# Patient Record
Sex: Female | Born: 1960
Health system: Southern US, Community
[De-identification: ages and names within clinical notes are randomized; demographics above are authoritative.]

## PROBLEM LIST (undated history)

## (undated) DIAGNOSIS — D039 Melanoma in situ, unspecified: Secondary | ICD-10-CM

## (undated) DIAGNOSIS — T7840XA Allergy, unspecified, initial encounter: Secondary | ICD-10-CM

## (undated) HISTORY — PX: FACIAL COSMETIC SURGERY: SHX629

## (undated) HISTORY — DX: Allergy, unspecified, initial encounter: T78.40XA

---

## 1983-08-08 DIAGNOSIS — D039 Melanoma in situ, unspecified: Secondary | ICD-10-CM | POA: Insufficient documentation

## 1997-11-19 ENCOUNTER — Other Ambulatory Visit: Admission: RE | Admit: 1997-11-19 | Discharge: 1997-11-19 | Payer: Self-pay | Admitting: Gynecology

## 1999-01-18 ENCOUNTER — Other Ambulatory Visit: Admission: RE | Admit: 1999-01-18 | Discharge: 1999-01-18 | Payer: Self-pay | Admitting: Gynecology

## 1999-12-14 ENCOUNTER — Encounter: Payer: Self-pay | Admitting: Gynecology

## 1999-12-14 ENCOUNTER — Encounter: Admission: RE | Admit: 1999-12-14 | Discharge: 1999-12-14 | Payer: Self-pay | Admitting: Gynecology

## 2000-01-20 ENCOUNTER — Other Ambulatory Visit: Admission: RE | Admit: 2000-01-20 | Discharge: 2000-01-20 | Payer: Self-pay | Admitting: Gynecology

## 2001-02-09 ENCOUNTER — Encounter: Admission: RE | Admit: 2001-02-09 | Discharge: 2001-02-09 | Payer: Self-pay | Admitting: Gynecology

## 2001-02-09 ENCOUNTER — Encounter: Payer: Self-pay | Admitting: Gynecology

## 2001-03-06 ENCOUNTER — Other Ambulatory Visit: Admission: RE | Admit: 2001-03-06 | Discharge: 2001-03-06 | Payer: Self-pay | Admitting: Gynecology

## 2001-09-12 ENCOUNTER — Encounter: Admission: RE | Admit: 2001-09-12 | Discharge: 2001-09-12 | Payer: Self-pay | Admitting: Otolaryngology

## 2001-09-12 ENCOUNTER — Encounter: Payer: Self-pay | Admitting: Otolaryngology

## 2002-03-14 ENCOUNTER — Encounter: Admission: RE | Admit: 2002-03-14 | Discharge: 2002-03-14 | Payer: Self-pay | Admitting: Gynecology

## 2002-03-14 ENCOUNTER — Encounter: Payer: Self-pay | Admitting: Gynecology

## 2002-04-08 ENCOUNTER — Other Ambulatory Visit: Admission: RE | Admit: 2002-04-08 | Discharge: 2002-04-08 | Payer: Self-pay | Admitting: Gynecology

## 2003-07-28 ENCOUNTER — Encounter: Admission: RE | Admit: 2003-07-28 | Discharge: 2003-07-28 | Payer: Self-pay | Admitting: Gynecology

## 2003-12-12 ENCOUNTER — Other Ambulatory Visit: Admission: RE | Admit: 2003-12-12 | Discharge: 2003-12-12 | Payer: Self-pay | Admitting: Obstetrics and Gynecology

## 2004-09-09 ENCOUNTER — Encounter: Admission: RE | Admit: 2004-09-09 | Discharge: 2004-09-09 | Payer: Self-pay | Admitting: Obstetrics and Gynecology

## 2005-10-12 ENCOUNTER — Other Ambulatory Visit: Admission: RE | Admit: 2005-10-12 | Discharge: 2005-10-12 | Payer: Self-pay | Admitting: Gynecology

## 2005-11-29 ENCOUNTER — Encounter: Admission: RE | Admit: 2005-11-29 | Discharge: 2005-11-29 | Payer: Self-pay | Admitting: Gynecology

## 2006-06-20 ENCOUNTER — Ambulatory Visit: Payer: Self-pay | Admitting: Internal Medicine

## 2006-06-26 ENCOUNTER — Encounter: Payer: Self-pay | Admitting: Internal Medicine

## 2006-06-26 LAB — CONVERTED CEMR LAB: Vit D, 1,25-Dihydroxy: 45 (ref 20–57)

## 2006-06-27 ENCOUNTER — Ambulatory Visit: Payer: Self-pay | Admitting: Internal Medicine

## 2006-06-27 LAB — CONVERTED CEMR LAB
ALT: 14 units/L (ref 0–40)
AST: 21 units/L (ref 0–37)
Albumin: 3.8 g/dL (ref 3.5–5.2)
Alkaline Phosphatase: 34 units/L — ABNORMAL LOW (ref 39–117)
BUN: 13 mg/dL (ref 6–23)
Basophils Absolute: 0 10*3/uL (ref 0.0–0.1)
Basophils Relative: 0.3 % (ref 0.0–1.0)
Bilirubin Urine: NEGATIVE
Bilirubin, Direct: 0.1 mg/dL (ref 0.0–0.3)
CO2: 29 meq/L (ref 19–32)
Calcium: 9 mg/dL (ref 8.4–10.5)
Chloride: 104 meq/L (ref 96–112)
Cholesterol: 190 mg/dL (ref 0–200)
Creatinine, Ser: 0.6 mg/dL (ref 0.4–1.2)
Eosinophils Absolute: 10.9 10*3/uL — ABNORMAL HIGH (ref 0.0–0.6)
Eosinophils Relative: 10.9 % — ABNORMAL HIGH (ref 0.0–5.0)
GFR calc Af Amer: 138 mL/min
GFR calc non Af Amer: 114 mL/min
Glucose, Bld: 83 mg/dL (ref 70–99)
H Pylori IgG: NEGATIVE
HCT: 35.5 % — ABNORMAL LOW (ref 36.0–46.0)
HDL: 49.9 mg/dL (ref >39.0)
Hemoglobin, Urine: NEGATIVE
Hemoglobin: 11.8 g/dL — ABNORMAL LOW (ref 12.0–15.0)
Ketones, ur: NEGATIVE mg/dL
LDL Cholesterol: 124 mg/dL — ABNORMAL HIGH (ref 0–99)
Leukocytes, UA: NEGATIVE
Lymphocytes Relative: 24.8 % (ref 12.0–46.0)
MCHC: 33.3 g/dL (ref 30.0–36.0)
MCV: 88.2 fL (ref 78.0–100.0)
Monocytes Absolute: 0.6 10*3/uL (ref 0.2–0.7)
Monocytes Relative: 10 % (ref 3.0–11.0)
Neutro Abs: 3.2 10*3/uL (ref 1.4–7.7)
Neutrophils Relative %: 54 % (ref 43.0–77.0)
Nitrite: NEGATIVE
Platelets: 196 10*3/uL (ref 150–400)
Potassium: 4 meq/L (ref 3.5–5.1)
RBC: 4.02 M/uL (ref 3.87–5.11)
RDW: 12.2 % (ref 11.5–14.6)
Sodium: 138 meq/L (ref 135–145)
Specific Gravity, Urine: 1.02 (ref 1.000–1.03)
TSH: 3.67 microintl units/mL (ref 0.35–5.50)
Total Bilirubin: 0.5 mg/dL (ref 0.3–1.2)
Total CHOL/HDL Ratio: 3.8
Total Protein, Urine: NEGATIVE mg/dL
Total Protein: 6.7 g/dL (ref 6.0–8.3)
Triglycerides: 83 mg/dL (ref 0–149)
Urine Glucose: NEGATIVE mg/dL
Urobilinogen, UA: 0.2 (ref 0.0–1.0)
VLDL: 17 mg/dL (ref 0–40)
WBC: 6 10*3/uL (ref 4.5–10.5)
pH: 6 (ref 5.0–8.0)

## 2007-01-12 ENCOUNTER — Other Ambulatory Visit: Admission: RE | Admit: 2007-01-12 | Discharge: 2007-01-12 | Payer: Self-pay | Admitting: Gynecology

## 2007-01-19 ENCOUNTER — Encounter: Admission: RE | Admit: 2007-01-19 | Discharge: 2007-01-19 | Payer: Self-pay | Admitting: Gynecology

## 2007-02-13 ENCOUNTER — Ambulatory Visit: Payer: Self-pay | Admitting: Internal Medicine

## 2007-02-13 DIAGNOSIS — J069 Acute upper respiratory infection, unspecified: Secondary | ICD-10-CM | POA: Insufficient documentation

## 2007-02-13 DIAGNOSIS — J45909 Unspecified asthma, uncomplicated: Secondary | ICD-10-CM | POA: Insufficient documentation

## 2007-02-13 DIAGNOSIS — J309 Allergic rhinitis, unspecified: Secondary | ICD-10-CM | POA: Insufficient documentation

## 2007-07-02 DIAGNOSIS — B001 Herpesviral vesicular dermatitis: Secondary | ICD-10-CM | POA: Insufficient documentation

## 2008-01-25 ENCOUNTER — Ambulatory Visit: Payer: Self-pay | Admitting: Internal Medicine

## 2008-01-25 ENCOUNTER — Telehealth: Payer: Self-pay | Admitting: Internal Medicine

## 2008-02-12 ENCOUNTER — Encounter: Admission: RE | Admit: 2008-02-12 | Discharge: 2008-02-12 | Payer: Self-pay | Admitting: Gynecology

## 2009-01-22 ENCOUNTER — Encounter: Payer: Self-pay | Admitting: Internal Medicine

## 2009-01-23 ENCOUNTER — Encounter: Payer: Self-pay | Admitting: Internal Medicine

## 2009-03-20 ENCOUNTER — Encounter: Admission: RE | Admit: 2009-03-20 | Discharge: 2009-03-20 | Payer: Self-pay | Admitting: Gynecology

## 2010-02-28 ENCOUNTER — Encounter: Payer: Self-pay | Admitting: Gynecology

## 2010-03-09 NOTE — Miscellaneous (Signed)
Summary: Flu Vaccination/[facility]  Flu Vaccination/[facility]   Imported By: Sherian Rein 01/26/2009 11:41:30  _____________________________________________________________________  External Attachment:    Type:   Image     Comment:   External Document

## 2010-03-09 NOTE — Miscellaneous (Signed)
Summary: Entered flu vacc.  Clinical Lists Changes       Not Administered:    Influenza Vaccine not given due to: Rec'd at [facility] on 01/22/09

## 2010-03-09 NOTE — Assessment & Plan Note (Signed)
Summary: asthma/cough SD   Vital Signs:  Patient Profile:   50 Years Old Female Weight:      166 pounds Temp:     98.6 degrees F oral Pulse rate:   80 / minute Pulse rhythm:   regular BP sitting:   134 / 88  (left arm) Cuff size:   regular  Vitals Entered By: Rock Nephew CMA (January 25, 2008 1:43 PM)                 Chief Complaint:  cough and bronchitis.  History of Present Illness: I am seeing this pt. for the first time today as she complains of a 1 week hx. of cough productive of yellow phlegm with ST, wheezing, SOB, and earaches.  Acute Visit History:      The patient complains of cough, earache, sinus problems, and sore throat.  She denies chest pain, fever, genitourinary symptoms, nasal discharge, nausea, rash, and vomiting.        The patient notes wheezing and shortness of breath.  The cough interferes with her sleep.  The character of the cough is described as productive.  She has no history of COPD.  There is no history of respiratory retractions, tachypnea, cyanosis, or interference with oral intake associated with her cough.        The earache is located on both sides.  There have been 'cold' or URI symptoms associated with the earache.  There is no history of recent antibiotic usage or recurrent otitis media associated with the earache.        'Cold' or URI symptoms have been present with the sore throat.  There is no history of dysphagia, drooling, or recent exposure to strep.        She complains of sinus pressure.  She denies a previous history of sinusitis or previous sinus surgery.           Updated Prior Medication List: VITAMIN D3 1000 UNIT  TABS (CHOLECALCIFEROL) 1 qd PROAIR HFA 108 (90 BASE) MCG/ACT  AERS (ALBUTEROL SULFATE) 2 inh q4h as needed shortness of breath  Current Allergies (reviewed today): No known allergies   Past Medical History:    Reviewed history from 02/13/2007 and no changes required:       Asthma/ cyclical cough  Allergic rhinitis cats  Past Surgical History:    Denies surgical history   Family History:    Reviewed history from 02/13/2007 and no changes required:       Family History Hypertension  Social History:    Reviewed history from 02/13/2007 and no changes required:       Occupation:       Married       Never Smoked       Alcohol use-no       Drug use-no       Regular exercise-yes   Risk Factors:  Tobacco use:  never Passive smoke exposure:  no Drug use:  no HIV high-risk behavior:  no Alcohol use:  no Exercise:  yes  Family History Risk Factors:    Family History of MI in females < 42 years old:  no    Family History of MI in males < 13 years old:  no   Review of Systems       The patient complains of enlarged lymph nodes.  The patient denies anorexia, fever, hoarseness, chest pain, hemoptysis, abdominal pain, and angioedema.     Physical Exam  General:  alert, well-developed, well-nourished, well-hydrated, appropriate dress, and normal appearance.   Lungs:     positive for good air movement but diffuse, bilateral inspiratory wheezes and rhonchi but no decreased breath sounds. normal respiratory effort, no intercostal retractions, no accessory muscle use, no dullness, no fremitus, and no crackles.   Heart:     Normal rate and regular rhythm. S1 and S2 normal without gallop, murmur, click, rub or other extra sounds. Abdomen:     Bowel sounds positive,abdomen soft and non-tender without masses, organomegaly or hernias noted. Extremities:     No clubbing, cyanosis, edema, or deformity noted with normal full range of motion of all joints.   Skin:     turgor normal, color normal, no rashes, no suspicious lesions, no ecchymoses, no petechiae, no purpura, no ulcerations, and no edema.   Psych:     Cognition and judgment appear intact. Alert and cooperative with normal attention span and concentration. No apparent delusions, illusions, hallucinations    Impression  & Recommendations:  Problem # 1:  UPPER RESPIRATORY INFECTION (URI) (ICD-465.9) Assessment: Deteriorated start Zpak The following medications were removed from the medication list:    Promethazine-codeine 6.25-10 Mg/40ml Syrp (Promethazine-codeine) .Marland Kitchen... 5-10 cc q 6h prn  Her updated medication list for this problem includes:    Tussionex Pennkinetic Er 8-10 Mg/55ml Lqcr (Chlorpheniramine-hydrocodone) .Marland KitchenMarland KitchenMarland KitchenMarland Kitchen 5 ml by mouth two times a day as needed for cough  Orders: Admin of Therapeutic Inj  intramuscular or subcutaneous (27253) Depo- Medrol 40mg  (J1030) Depo- Medrol 80mg  (J1040) Nebulizer Tx (66440)   Problem # 2:  ASTHMA (ICD-493.90) Assessment: Deteriorated depo-medrol 120mg  IM. She received perforomist 20 mg via neb and repeat exam shows a marked decrease in wheezes and rhonchi Her updated medication list for this problem includes:    Proair Hfa 108 (90 Base) Mcg/act Aers (Albuterol sulfate) .Marland Kitchen... 2 inh q4h as needed shortness of breath  Orders: Nebulizer Tx (34742)   Complete Medication List: 1)  Vitamin D3 1000 Unit Tabs (Cholecalciferol) .Marland Kitchen.. 1 qd 2)  Proair Hfa 108 (90 Base) Mcg/act Aers (Albuterol sulfate) .... 2 inh q4h as needed shortness of breath 3)  Zithromax 500 Mg Tab (Azithromycin) .... One by mouth once daily for 3 days 4)  Tussionex Pennkinetic Er 8-10 Mg/22ml Lqcr (Chlorpheniramine-hydrocodone) .... 5 ml by mouth two times a day as needed for cough   Patient Instructions: 1)  Please schedule a follow-up appointment in 2 weeks. 2)  It is important to use your inhaler properly. Use a spacer, take slow deep breaths and hold them. Rinse your mouth after using.  3)  Take your antibiotic as prescribed until ALL of it is gone, but stop if you develop a rash or swelling and contact our office as soon as possible. 4)  Acute bronchitis symptoms for less than 10 days are not helped by antibiotics. take over the counter cough medications. call if no improvment in  5-7 days,  sooner if increasing cough, fever, or new symptoms( shortness of breath, chest pain).   Prescriptions: TUSSIONEX PENNKINETIC ER 8-10 MG/5ML LQCR (CHLORPHENIRAMINE-HYDROCODONE) 5 ml by mouth two times a day as needed for cough  #3 ounces x 0   Entered and Authorized by:   Etta Grandchild MD   Signed by:   Etta Grandchild MD on 01/25/2008   Method used:   Print then Give to Patient   RxID:   [id number] ZITHROMAX 500 MG TAB (AZITHROMYCIN) one by mouth once daily for 3 days  #3 x  0   Entered and Authorized by:   Etta Grandchild MD   Signed by:   Etta Grandchild MD on 01/25/2008   Method used:   Print then Give to Patient   RxID:   [id number]  ]  Medication Administration  Injection # 1:    Medication: Depo- Medrol 80mg     Diagnosis: UPPER RESPIRATORY INFECTION (URI) (ICD-465.9)    Route: IM    Site: RUOQ gluteus    Exp Date: 02/2009    Lot #: [id number]    Mfr: sicor    Patient tolerated injection without complications    Given by: Rock Nephew CMA (January 25, 2008 2:21 PM)  Injection # 2:    Medication: Depo- Medrol 40mg     Diagnosis: UPPER RESPIRATORY INFECTION (URI) (ICD-465.9)    Route: IM    Site: RUOQ gluteus    Exp Date: 02/2009    Lot #: [id number]    Mfr: sicor    Patient tolerated injection without complications    Given by: Rock Nephew CMA (January 25, 2008 2:21 PM)  Orders Added: 1)  Admin of Therapeutic Inj  intramuscular or subcutaneous [96372] 2)  Depo- Medrol 40mg  [J1030] 3)  Depo- Medrol 80mg  [J1040] 4)  Est. Patient Level IV [84696] 5)  Nebulizer Tx [29528]

## 2010-03-09 NOTE — Progress Notes (Signed)
Summary: Req for Rx  Phone Note Call from Patient   Summary of Call: Pt says she gets asthmatic bronchitis every year after a "cold' which she has now. Pt usually gets zpak, albuterol & prometh/cod cough syrup. She has albuterol & cough med. Patient is requesting rx for zpak for bronchitis. Scheduled pt for 1:45 with Dr Yetta Barre Initial call taken by: Lamar Sprinkles,  January 25, 2008 9:39 AM

## 2010-03-12 ENCOUNTER — Other Ambulatory Visit: Payer: Self-pay | Admitting: Gynecology

## 2010-03-12 DIAGNOSIS — Z1239 Encounter for other screening for malignant neoplasm of breast: Secondary | ICD-10-CM

## 2010-03-22 ENCOUNTER — Ambulatory Visit
Admission: RE | Admit: 2010-03-22 | Discharge: 2010-03-22 | Disposition: A | Discharge status: Home / Self Care | Payer: BC Managed Care – PPO | Source: Ambulatory Visit | Attending: Gynecology | Admitting: Gynecology

## 2010-03-22 DIAGNOSIS — Z1239 Encounter for other screening for malignant neoplasm of breast: Secondary | ICD-10-CM

## 2010-12-02 ENCOUNTER — Telehealth: Payer: Self-pay | Admitting: *Deleted

## 2010-12-02 DIAGNOSIS — Z0000 Encounter for general adult medical examination without abnormal findings: Secondary | ICD-10-CM

## 2010-12-02 NOTE — Telephone Encounter (Signed)
Message copied by Merrilyn Puma on Thu Dec 02, 2010  9:53 AM ------      Message from: Earl Lagos      Created: Wed Dec 01, 2010  4:57 PM      Regarding: labs       Please schedule labs for cpx 02/07/11. Thanks

## 2010-12-02 NOTE — Telephone Encounter (Signed)
Labs entered.

## 2011-01-27 ENCOUNTER — Other Ambulatory Visit (INDEPENDENT_AMBULATORY_CARE_PROVIDER_SITE_OTHER): Payer: BC Managed Care – PPO

## 2011-01-27 DIAGNOSIS — Z0000 Encounter for general adult medical examination without abnormal findings: Secondary | ICD-10-CM

## 2011-01-27 LAB — CBC WITH DIFFERENTIAL/PLATELET
Basophils Absolute: 0 10*3/uL (ref 0.0–0.1)
Basophils Relative: 0.7 % (ref 0.0–3.0)
Eosinophils Absolute: 0.2 10*3/uL (ref 0.0–0.7)
Eosinophils Relative: 4.4 % (ref 0.0–5.0)
HCT: 39.9 % (ref 36.0–46.0)
Hemoglobin: 13.7 g/dL (ref 12.0–15.0)
Lymphocytes Relative: 31.9 % (ref 12.0–46.0)
Lymphs Abs: 1.6 10*3/uL (ref 0.7–4.0)
MCHC: 34.4 g/dL (ref 30.0–36.0)
MCV: 94.8 fl (ref 78.0–100.0)
Monocytes Absolute: 0.4 10*3/uL (ref 0.1–1.0)
Monocytes Relative: 9 % (ref 3.0–12.0)
Neutro Abs: 2.7 10*3/uL (ref 1.4–7.7)
Neutrophils Relative %: 54 % (ref 43.0–77.0)
Platelets: 183 10*3/uL (ref 150.0–400.0)
RBC: 4.21 Mil/uL (ref 3.87–5.11)
RDW: 13.1 % (ref 11.5–14.6)
WBC: 5 10*3/uL (ref 4.5–10.5)

## 2011-01-27 LAB — BASIC METABOLIC PANEL
BUN: 18 mg/dL (ref 6–23)
CO2: 29 mEq/L (ref 19–32)
Calcium: 8.9 mg/dL (ref 8.4–10.5)
Chloride: 102 mEq/L (ref 96–112)
Creatinine, Ser: 0.9 mg/dL (ref 0.4–1.2)
GFR: 68.01 mL/min (ref >60.00)
Glucose, Bld: 94 mg/dL (ref 70–99)
Potassium: 3.8 mEq/L (ref 3.5–5.1)
Sodium: 137 mEq/L (ref 135–145)

## 2011-01-27 LAB — URINALYSIS, ROUTINE W REFLEX MICROSCOPIC
Bilirubin Urine: NEGATIVE
Ketones, ur: NEGATIVE
Leukocytes, UA: NEGATIVE
Nitrite: NEGATIVE
Specific Gravity, Urine: 1.015 (ref 1.000–1.030)
Total Protein, Urine: NEGATIVE
Urine Glucose: NEGATIVE
Urobilinogen, UA: 0.2 (ref 0.0–1.0)
pH: 7.5 (ref 5.0–8.0)

## 2011-01-27 LAB — LIPID PANEL
Cholesterol: 174 mg/dL (ref 0–200)
HDL: 50.8 mg/dL (ref >39.00)
LDL Cholesterol: 103 mg/dL — ABNORMAL HIGH (ref 0–99)
Total CHOL/HDL Ratio: 3
Triglycerides: 103 mg/dL (ref 0.0–149.0)
VLDL: 20.6 mg/dL (ref 0.0–40.0)

## 2011-01-27 LAB — HEPATIC FUNCTION PANEL
ALT: 21 U/L (ref 0–35)
AST: 23 U/L (ref 0–37)
Albumin: 4 g/dL (ref 3.5–5.2)
Alkaline Phosphatase: 48 U/L (ref 39–117)
Bilirubin, Direct: 0 mg/dL (ref 0.0–0.3)
Total Bilirubin: 0.6 mg/dL (ref 0.3–1.2)
Total Protein: 6.9 g/dL (ref 6.0–8.3)

## 2011-01-27 LAB — TSH: TSH: 2.23 u[IU]/mL (ref 0.35–5.50)

## 2011-02-04 ENCOUNTER — Encounter: Payer: Self-pay | Admitting: Internal Medicine

## 2011-02-07 ENCOUNTER — Ambulatory Visit (INDEPENDENT_AMBULATORY_CARE_PROVIDER_SITE_OTHER): Payer: BC Managed Care – PPO | Admitting: Internal Medicine

## 2011-02-07 ENCOUNTER — Encounter: Payer: Self-pay | Admitting: Internal Medicine

## 2011-02-07 VITALS — BP 120/88 | HR 84 | Temp 97.2°F | Resp 16 | Ht 69.0 in | Wt 169.0 lb

## 2011-02-07 DIAGNOSIS — Z23 Encounter for immunization: Secondary | ICD-10-CM

## 2011-02-07 DIAGNOSIS — B079 Viral wart, unspecified: Secondary | ICD-10-CM

## 2011-02-07 DIAGNOSIS — Z0000 Encounter for general adult medical examination without abnormal findings: Secondary | ICD-10-CM

## 2011-02-07 DIAGNOSIS — Z136 Encounter for screening for cardiovascular disorders: Secondary | ICD-10-CM

## 2011-02-07 NOTE — Progress Notes (Signed)
  Subjective:    Patient ID: Megan Garner, female    DOB: 06-23-1960, 50 y.o.   MRN: [id number]  HPI  The patient is here for a wellness exam. The patient has been doing well overall without major physical or psychological issues going on lately.Review of Systems  Constitutional: Negative for fever, chills, diaphoresis, activity change, appetite change, fatigue and unexpected weight change.  HENT: Negative for hearing loss, ear pain, congestion, sore throat, sneezing, mouth sores, neck pain, dental problem, voice change, postnasal drip and sinus pressure.   Eyes: Negative for pain and visual disturbance.  Respiratory: Negative for cough, chest tightness, wheezing and stridor.   Cardiovascular: Negative for chest pain, palpitations and leg swelling.  Gastrointestinal: Negative for nausea, vomiting, abdominal pain, blood in stool, abdominal distention and rectal pain.  Genitourinary: Negative for dysuria, hematuria, decreased urine volume, vaginal bleeding, vaginal discharge, difficulty urinating, vaginal pain and menstrual problem.  Musculoskeletal: Negative for back pain, joint swelling and gait problem.  Skin: Positive for rash (warts on arm). Negative for color change and wound.  Neurological: Negative for dizziness, tremors, syncope, speech difficulty and light-headedness.  Hematological: Negative for adenopathy.  Psychiatric/Behavioral: Negative for suicidal ideas, hallucinations, behavioral problems, confusion, sleep disturbance, dysphoric mood and decreased concentration. The patient is not hyperactive.        Objective:   Physical Exam  Constitutional: She appears well-developed. No distress.  HENT:  Head: Normocephalic.  Right Ear: External ear normal.  Left Ear: External ear normal.  Nose: Nose normal.  Mouth/Throat: Oropharynx is clear and moist.  Eyes: Conjunctivae are normal. Pupils are equal, round, and reactive to light. Right eye exhibits no discharge. Left eye exhibits no  discharge.  Neck: Normal range of motion. Neck supple. No JVD present. No tracheal deviation present. No thyromegaly present.  Cardiovascular: Normal rate, regular rhythm and normal heart sounds.   Pulmonary/Chest: No stridor. No respiratory distress. She has no wheezes.  Abdominal: Soft. Bowel sounds are normal. She exhibits no distension and no mass. There is no tenderness. There is no rebound and no guarding.  Musculoskeletal: She exhibits no edema and no tenderness.  Lymphadenopathy:    She has no cervical adenopathy.  Neurological: She displays normal reflexes. No cranial nerve deficit. She exhibits normal muscle tone. Coordination normal.  Skin: No rash noted. No erythema.       R mid forearm and L elbow 3 mm warts  Psychiatric: She has a normal mood and affect. Her behavior is normal. Judgment and thought content normal.     Procedure Note :     Procedure : Cryosurgery   Indication:  Wart(s)    Risks including unsuccessful procedure , bleeding, infection, bruising, scar, a need for a repeat  procedure and others were explained to the patient in detail as well as the benefits. Informed consent was obtained verbally.   2  lesion(s)  on  R forearm  was/were treated with liquid nitrogen on a Q-tip in a usual fasion . Band-Aid was applied and antibiotic ointment was given for a later use.   Tolerated well. Complications none.   Postprocedure instructions :     Keep the wounds clean. You can wash them with liquid soap and water. Pat dry with gauze or a Kleenex tissue  Before applying antibiotic ointment and a Band-Aid.   You need to report immediately  if  any signs of infection develop.         Assessment & Plan:

## 2011-03-01 ENCOUNTER — Other Ambulatory Visit: Payer: Self-pay | Admitting: Gynecology

## 2011-03-01 DIAGNOSIS — Z1231 Encounter for screening mammogram for malignant neoplasm of breast: Secondary | ICD-10-CM

## 2011-03-28 ENCOUNTER — Ambulatory Visit
Admission: RE | Admit: 2011-03-28 | Discharge: 2011-03-28 | Disposition: A | Discharge status: Home / Self Care | Payer: BC Managed Care – PPO | Source: Ambulatory Visit | Attending: Gynecology | Admitting: Gynecology

## 2011-03-28 DIAGNOSIS — Z1231 Encounter for screening mammogram for malignant neoplasm of breast: Secondary | ICD-10-CM

## 2011-04-24 DIAGNOSIS — B079 Viral wart, unspecified: Secondary | ICD-10-CM | POA: Insufficient documentation

## 2011-04-24 NOTE — Assessment & Plan Note (Signed)
Options discussed She elected cryo

## 2012-06-13 ENCOUNTER — Other Ambulatory Visit: Payer: Self-pay

## 2012-06-13 DIAGNOSIS — Z1231 Encounter for screening mammogram for malignant neoplasm of breast: Secondary | ICD-10-CM

## 2012-07-09 ENCOUNTER — Ambulatory Visit
Admission: RE | Admit: 2012-07-09 | Discharge: 2012-07-09 | Disposition: A | Discharge status: Home / Self Care | Payer: BC Managed Care – PPO | Source: Ambulatory Visit

## 2012-07-09 DIAGNOSIS — Z1231 Encounter for screening mammogram for malignant neoplasm of breast: Secondary | ICD-10-CM

## 2013-07-24 ENCOUNTER — Other Ambulatory Visit: Payer: Self-pay

## 2013-07-24 DIAGNOSIS — Z1231 Encounter for screening mammogram for malignant neoplasm of breast: Secondary | ICD-10-CM

## 2013-08-27 ENCOUNTER — Ambulatory Visit
Admission: RE | Admit: 2013-08-27 | Discharge: 2013-08-27 | Disposition: A | Discharge status: Home / Self Care | Payer: BC Managed Care – PPO | Source: Ambulatory Visit

## 2013-08-27 ENCOUNTER — Other Ambulatory Visit (INDEPENDENT_AMBULATORY_CARE_PROVIDER_SITE_OTHER): Payer: BC Managed Care – PPO

## 2013-08-27 ENCOUNTER — Encounter: Payer: Self-pay | Admitting: Internal Medicine

## 2013-08-27 ENCOUNTER — Ambulatory Visit (INDEPENDENT_AMBULATORY_CARE_PROVIDER_SITE_OTHER): Payer: BC Managed Care – PPO | Admitting: Internal Medicine

## 2013-08-27 VITALS — BP 102/78 | HR 79 | Temp 97.8°F | Ht 69.0 in | Wt 166.4 lb

## 2013-08-27 DIAGNOSIS — Z0000 Encounter for general adult medical examination without abnormal findings: Secondary | ICD-10-CM

## 2013-08-27 DIAGNOSIS — Z1231 Encounter for screening mammogram for malignant neoplasm of breast: Secondary | ICD-10-CM

## 2013-08-27 DIAGNOSIS — Z1211 Encounter for screening for malignant neoplasm of colon: Secondary | ICD-10-CM | POA: Insufficient documentation

## 2013-08-27 LAB — CBC WITH DIFFERENTIAL/PLATELET
Basophils Absolute: 0 10*3/uL (ref 0.0–0.1)
Basophils Relative: 0.4 % (ref 0.0–3.0)
Eosinophils Absolute: 0.2 10*3/uL (ref 0.0–0.7)
Eosinophils Relative: 3.1 % (ref 0.0–5.0)
HCT: 39.4 % (ref 36.0–46.0)
Hemoglobin: 13.8 g/dL (ref 12.0–15.0)
Lymphocytes Relative: 32.6 % (ref 12.0–46.0)
Lymphs Abs: 1.6 10*3/uL (ref 0.7–4.0)
MCHC: 35 g/dL (ref 30.0–36.0)
MCV: 92.1 fl (ref 78.0–100.0)
Monocytes Absolute: 0.4 10*3/uL (ref 0.1–1.0)
Monocytes Relative: 9.2 % (ref 3.0–12.0)
Neutro Abs: 2.7 10*3/uL (ref 1.4–7.7)
Neutrophils Relative %: 54.7 % (ref 43.0–77.0)
Platelets: 193 10*3/uL (ref 150.0–400.0)
RBC: 4.28 Mil/uL (ref 3.87–5.11)
RDW: 13.2 % (ref 11.5–15.5)
WBC: 4.9 10*3/uL (ref 4.0–10.5)

## 2013-08-27 LAB — URINALYSIS
Bilirubin Urine: NEGATIVE
Hgb urine dipstick: NEGATIVE
Ketones, ur: NEGATIVE
Leukocytes, UA: NEGATIVE
Nitrite: NEGATIVE
Specific Gravity, Urine: <=1.005 — AB (ref 1.000–1.030)
Total Protein, Urine: NEGATIVE
Urine Glucose: NEGATIVE
Urobilinogen, UA: 0.2 (ref 0.0–1.0)
pH: 7 (ref 5.0–8.0)

## 2013-08-27 LAB — BASIC METABOLIC PANEL
BUN: 14 mg/dL (ref 6–23)
CO2: 29 mEq/L (ref 19–32)
Calcium: 9.5 mg/dL (ref 8.4–10.5)
Chloride: 100 mEq/L (ref 96–112)
Creatinine, Ser: 0.7 mg/dL (ref 0.4–1.2)
GFR: 90.82 mL/min (ref >60.00)
Glucose, Bld: 95 mg/dL (ref 70–99)
Potassium: 4.2 mEq/L (ref 3.5–5.1)
Sodium: 136 mEq/L (ref 135–145)

## 2013-08-27 LAB — HEPATIC FUNCTION PANEL
ALT: 16 U/L (ref 0–35)
AST: 22 U/L (ref 0–37)
Albumin: 4.1 g/dL (ref 3.5–5.2)
Alkaline Phosphatase: 63 U/L (ref 39–117)
Bilirubin, Direct: 0.1 mg/dL (ref 0.0–0.3)
Total Bilirubin: 0.8 mg/dL (ref 0.2–1.2)
Total Protein: 7 g/dL (ref 6.0–8.3)

## 2013-08-27 LAB — TSH: TSH: 3.4 u[IU]/mL (ref 0.35–4.50)

## 2013-08-27 LAB — LIPID PANEL
Cholesterol: 197 mg/dL (ref 0–200)
HDL: 69.3 mg/dL (ref >39.00)
LDL Cholesterol: 115 mg/dL — ABNORMAL HIGH (ref 0–99)
NonHDL: 127.7
Total CHOL/HDL Ratio: 3
Triglycerides: 66 mg/dL (ref 0.0–149.0)
VLDL: 13.2 mg/dL (ref 0.0–40.0)

## 2013-08-27 MED ORDER — CITALOPRAM HYDROBROMIDE 20 MG PO TABS: 20.0000 mg | ORAL_TABLET | Freq: Every day | ORAL | Status: DC

## 2013-08-27 NOTE — Progress Notes (Signed)
   Subjective:    HPI  The patient is here for a wellness exam. The patient has been doing well overall without major physical or psychological issues going on lately.  The patient needs to address  chronic anxiety  BP Readings from Last 3 Encounters:  08/27/13 102/78  02/07/11 120/88  01/25/08 134/88   Wt Readings from Last 3 Encounters:  08/27/13 166 lb 6.4 oz (75.479 kg)  02/07/11 169 lb (76.658 kg)  01/25/08 166 lb (75.297 kg)      Review of Systems  Constitutional: Negative for chills, activity change, appetite change, fatigue and unexpected weight change.  HENT: Negative for congestion, mouth sores and sinus pressure.   Eyes: Negative for visual disturbance.  Respiratory: Negative for cough and chest tightness.   Gastrointestinal: Negative for nausea and abdominal pain.  Genitourinary: Negative for frequency, difficulty urinating and vaginal pain.  Musculoskeletal: Negative for back pain and gait problem.  Skin: Negative for pallor and rash.  Neurological: Negative for dizziness, tremors, weakness, numbness and headaches.  Psychiatric/Behavioral: Negative for confusion and sleep disturbance.       Objective:   Physical Exam  Constitutional: She appears well-developed. No distress.  HENT:  Head: Normocephalic.  Right Ear: External ear normal.  Left Ear: External ear normal.  Nose: Nose normal.  Mouth/Throat: Oropharynx is clear and moist.  Eyes: Conjunctivae are normal. Pupils are equal, round, and reactive to light. Right eye exhibits no discharge. Left eye exhibits no discharge.  Neck: Normal range of motion. Neck supple. No JVD present. No tracheal deviation present. No thyromegaly present.  Cardiovascular: Normal rate, regular rhythm and normal heart sounds.   Pulmonary/Chest: No stridor. No respiratory distress. She has no wheezes.  Abdominal: Soft. Bowel sounds are normal. She exhibits no distension and no mass. There is no tenderness. There is no rebound  and no guarding.  Musculoskeletal: She exhibits no edema and no tenderness.  Lymphadenopathy:    She has no cervical adenopathy.  Neurological: She displays normal reflexes. No cranial nerve deficit. She exhibits normal muscle tone. Coordination normal.  Skin: No rash noted. No erythema.  Psychiatric: She has a normal mood and affect. Her behavior is normal. Judgment and thought content normal.    Lab Results  Component Value Date   WBC 4.9 08/27/2013   HGB 13.8 08/27/2013   HCT 39.4 08/27/2013   PLT 193.0 08/27/2013   GLUCOSE 95 08/27/2013   CHOL 197 08/27/2013   TRIG 66.0 08/27/2013   HDL 69.30 08/27/2013   LDLCALC 115* 08/27/2013   ALT 16 08/27/2013   AST 22 08/27/2013   NA 136 08/27/2013   K 4.2 08/27/2013   CL 100 08/27/2013   CREATININE 0.7 08/27/2013   BUN 14 08/27/2013   CO2 29 08/27/2013   TSH 3.40 08/27/2013         Assessment & Plan:

## 2013-08-27 NOTE — Assessment & Plan Note (Signed)
We discussed age appropriate health related issues, including available/recomended screening tests and vaccinations. We discussed a need for adhering to healthy diet and exercise. Labs/EKG were reviewed/ordered. All questions were answered. LMP 12/14

## 2013-08-27 NOTE — Progress Notes (Deleted)
Pre visit review using our clinic review tool, if applicable. No additional management support is needed unless otherwise documented below in the visit note. 

## 2013-08-27 NOTE — Progress Notes (Signed)
Patient ID: Mikael SprayLaura Catlin, female   DOB: 12/29/1960, 53 y.o.   MRN: [id number]

## 2013-08-27 NOTE — Patient Instructions (Signed)
Zostavax

## 2014-01-19 ENCOUNTER — Ambulatory Visit (INDEPENDENT_AMBULATORY_CARE_PROVIDER_SITE_OTHER): Payer: BC Managed Care – PPO | Admitting: Physician Assistant

## 2014-01-19 VITALS — BP 120/72 | HR 88 | Temp 98.0°F | Resp 16 | Ht 69.0 in | Wt 169.6 lb

## 2014-01-19 DIAGNOSIS — R05 Cough: Secondary | ICD-10-CM

## 2014-01-19 DIAGNOSIS — R059 Cough, unspecified: Secondary | ICD-10-CM

## 2014-01-19 DIAGNOSIS — J019 Acute sinusitis, unspecified: Secondary | ICD-10-CM

## 2014-01-19 MED ORDER — AMOXICILLIN-POT CLAVULANATE 875-125 MG PO TABS: 1.0000 | ORAL_TABLET | Freq: Two times a day (BID) | ORAL | Status: AC

## 2014-01-19 MED ORDER — HYDROCOD POLST-CHLORPHEN POLST 10-8 MG/5ML PO LQCR: 5.0000 mL | Freq: Two times a day (BID) | ORAL | Status: DC | PRN

## 2014-01-19 MED ORDER — ALBUTEROL SULFATE HFA 108 (90 BASE) MCG/ACT IN AERS: 2.0000 | INHALATION_SPRAY | RESPIRATORY_TRACT | Status: DC | PRN

## 2014-01-19 MED ORDER — IPRATROPIUM BROMIDE 0.03 % NA SOLN: 2.0000 | Freq: Two times a day (BID) | NASAL | Status: DC

## 2014-01-19 NOTE — Addendum Note (Signed)
Addended by: Johnnette LitterARDWELL, Celese Banner M on: 01/19/2014 11:41 AM   Modules accepted: Orders

## 2014-01-19 NOTE — Patient Instructions (Signed)
Get plenty of rest and drink at least 64 ounces of water daily. 

## 2014-01-19 NOTE — Progress Notes (Signed)
Subjective:    Patient ID: Megan SprayLaura Keirsey, female    DOB: 09/19/1960, 53 y.o.   MRN: [id number]   PCP: Sonda PrimesAlex Plotnikov, MD  Chief Complaint  Patient presents with  . Cough    x 10 days , non productive   . chest congestion  . Generalized Body Aches    No Known Allergies  Patient Active Problem List   Diagnosis Date Noted  . Well adult exam 08/27/2013  . Warts 04/24/2011  . UPPER RESPIRATORY INFECTION (URI) 02/13/2007  . ALLERGIC RHINITIS 02/13/2007  . ASTHMA 02/13/2007    Prior to Admission medications   Medication Sig Start Date End Date Taking? Authorizing Provider  Calcium Carbonate-Vitamin D (CALCIUM 600 + D PO) Take 1 each by mouth daily.     Yes Historical Provider, MD  Cholecalciferol (VITAMIN D3) 1000 UNITS CAPS Take by mouth.     Yes Historical Provider, MD  citalopram (CELEXA) 20 MG tablet Take 1 tablet (20 mg total) by mouth daily. 08/27/13  Yes Aleksei Plotnikov V, MD  famotidine (PEPCID) 20 MG tablet Take 20 mg by mouth 2 (two) times daily.     Yes Historical Provider, MD  loratadine (CLARITIN) 10 MG tablet Take 10 mg by mouth daily.     Yes Historical Provider, MD  Multiple Vitamins-Minerals (MULTIVITAMIN PO) Take 1 tablet by mouth daily.     Yes Historical Provider, MD  Omega-3 Fatty Acids (FISH OIL) 1000 MG CAPS Take 1 each by mouth daily.     Yes Historical Provider, MD    Medical, Surgical, Family and Social History reviewed and updated.  HPI  Presents for evaluation of sinus congestion, pressure and drainage x 10 days and worsening. Cough developed a few days ago and keeps her awake. "My assistant has pneumonia" "I have a tendency to get asthmatic bronchitis" Works as a Firefighterfinancial advisor  Fever and chills initially, none now. Feels achy and run down.  Review of Systems As above.    Objective:   Physical Exam  Constitutional: She is oriented to person, place, and time. Vital signs are normal. She appears well-developed and well-nourished. No  distress.  BP 120/72 mmHg  Pulse 88  Temp(Src) 98 F (36.7 C) (Oral)  Resp 16  Ht 5\' 9"  (1.753 m)  Wt 169 lb 9.6 oz (76.93 kg)  BMI 25.03 kg/m2  SpO2 99%  LMP 03/24/2011   HENT:  Head: Normocephalic and atraumatic.  Right Ear: Hearing, tympanic membrane, external ear and ear canal normal.  Left Ear: Hearing, tympanic membrane, external ear and ear canal normal.  Nose: Mucosal edema and rhinorrhea present.  No foreign bodies. Right sinus exhibits no maxillary sinus tenderness and no frontal sinus tenderness. Left sinus exhibits no maxillary sinus tenderness and no frontal sinus tenderness.  Mouth/Throat: Uvula is midline, oropharynx is clear and moist and mucous membranes are normal. No uvula swelling. No oropharyngeal exudate.  Eyes: Conjunctivae and EOM are normal. Pupils are equal, round, and reactive to light. Right eye exhibits no discharge. Left eye exhibits no discharge. No scleral icterus.  Neck: Trachea normal, normal range of motion and full passive range of motion without pain. Neck supple. No thyroid mass and no thyromegaly present.  Cardiovascular: Normal rate, regular rhythm and normal heart sounds.   Pulmonary/Chest: Effort normal and breath sounds normal.  Lymphadenopathy:       Head (right side): No submandibular, no tonsillar, no preauricular, no posterior auricular and no occipital adenopathy present.  Head (left side): No submandibular, no tonsillar, no preauricular and no occipital adenopathy present.    She has no cervical adenopathy.       Right: No supraclavicular adenopathy present.       Left: No supraclavicular adenopathy present.  Neurological: She is alert and oriented to person, place, and time. She has normal strength. No cranial nerve deficit or sensory deficit.  Skin: Skin is warm, dry and intact. No rash noted.  Psychiatric: She has a normal mood and affect. Her speech is normal and behavior is normal.  Vitals reviewed.        Assessment &  Plan:  1. Subacute sinusitis, unspecified location - ipratropium (ATROVENT) 0.03 % nasal Garner; Place 2 sprays into both nostrils 2 (two) times daily.  Dispense: 30 mL; Refill: 0 - amoxicillin-clavulanate (AUGMENTIN) 875-125 MG per tablet; Take 1 tablet by mouth 2 (two) times daily.  Dispense: 20 tablet; Refill: 0  2. Cough - chlorpheniramine-HYDROcodone (TUSSIONEX PENNKINETIC ER) 10-8 MG/5ML LQCR; Take 5 mLs by mouth every 12 (twelve) hours as needed for cough (cough).  Dispense: 100 mL; Refill: 0  Supportive care.  Anticipatory guidance.  RTC if symptoms worsen/persist.   Fernande Brashelle S. Rylea Selway, PA-C Physician Assistant-Certified [facility]

## 2014-06-05 ENCOUNTER — Encounter: Payer: Self-pay | Admitting: Internal Medicine

## 2014-06-05 MED ORDER — CITALOPRAM HYDROBROMIDE 20 MG PO TABS: 20.0000 mg | ORAL_TABLET | Freq: Every day | ORAL | Status: DC

## 2014-07-11 ENCOUNTER — Telehealth: Payer: Self-pay | Admitting: Internal Medicine

## 2014-07-11 NOTE — Telephone Encounter (Signed)
Rec'd from [facility] forward 4 pages to Dr.Plotnikov °

## 2014-09-22 ENCOUNTER — Other Ambulatory Visit: Payer: Self-pay

## 2014-09-22 DIAGNOSIS — Z1231 Encounter for screening mammogram for malignant neoplasm of breast: Secondary | ICD-10-CM

## 2014-10-09 LAB — HM MAMMOGRAPHY

## 2014-10-09 LAB — HM PAP SMEAR

## 2014-11-05 ENCOUNTER — Ambulatory Visit: Payer: Self-pay

## 2014-11-18 ENCOUNTER — Ambulatory Visit (INDEPENDENT_AMBULATORY_CARE_PROVIDER_SITE_OTHER): Payer: BLUE CROSS/BLUE SHIELD | Admitting: Internal Medicine

## 2014-11-18 ENCOUNTER — Encounter: Payer: Self-pay | Admitting: Internal Medicine

## 2014-11-18 ENCOUNTER — Other Ambulatory Visit (INDEPENDENT_AMBULATORY_CARE_PROVIDER_SITE_OTHER): Payer: BLUE CROSS/BLUE SHIELD

## 2014-11-18 VITALS — BP 108/82 | HR 72 | Ht 69.0 in | Wt 171.0 lb

## 2014-11-18 DIAGNOSIS — Z0000 Encounter for general adult medical examination without abnormal findings: Secondary | ICD-10-CM | POA: Diagnosis not present

## 2014-11-18 LAB — CBC WITH DIFFERENTIAL/PLATELET
Basophils Absolute: 0 10*3/uL (ref 0.0–0.1)
Basophils Relative: 0.7 % (ref 0.0–3.0)
Eosinophils Absolute: 0.1 10*3/uL (ref 0.0–0.7)
Eosinophils Relative: 3.4 % (ref 0.0–5.0)
HCT: 40.3 % (ref 36.0–46.0)
Hemoglobin: 13.8 g/dL (ref 12.0–15.0)
Lymphocytes Relative: 31.1 % (ref 12.0–46.0)
Lymphs Abs: 1.2 10*3/uL (ref 0.7–4.0)
MCHC: 34.1 g/dL (ref 30.0–36.0)
MCV: 91.4 fl (ref 78.0–100.0)
Monocytes Absolute: 0.3 10*3/uL (ref 0.1–1.0)
Monocytes Relative: 7.3 % (ref 3.0–12.0)
Neutro Abs: 2.3 10*3/uL (ref 1.4–7.7)
Neutrophils Relative %: 57.5 % (ref 43.0–77.0)
Platelets: 191 10*3/uL (ref 150.0–400.0)
RBC: 4.41 Mil/uL (ref 3.87–5.11)
RDW: 12.7 % (ref 11.5–15.5)
WBC: 4 10*3/uL (ref 4.0–10.5)

## 2014-11-18 LAB — URINALYSIS, ROUTINE W REFLEX MICROSCOPIC
Bilirubin Urine: NEGATIVE
Hgb urine dipstick: NEGATIVE
Ketones, ur: NEGATIVE
Nitrite: NEGATIVE
Specific Gravity, Urine: 1.01 (ref 1.000–1.030)
Total Protein, Urine: NEGATIVE
Urine Glucose: NEGATIVE
Urobilinogen, UA: 0.2 (ref 0.0–1.0)
pH: 8.5 — AB (ref 5.0–8.0)

## 2014-11-18 LAB — HEPATIC FUNCTION PANEL
ALT: 14 U/L (ref 0–35)
AST: 18 U/L (ref 0–37)
Albumin: 4.1 g/dL (ref 3.5–5.2)
Alkaline Phosphatase: 76 U/L (ref 39–117)
Bilirubin, Direct: 0.1 mg/dL (ref 0.0–0.3)
Total Bilirubin: 0.6 mg/dL (ref 0.2–1.2)
Total Protein: 7.2 g/dL (ref 6.0–8.3)

## 2014-11-18 LAB — BASIC METABOLIC PANEL
BUN: 14 mg/dL (ref 6–23)
CO2: 28 mEq/L (ref 19–32)
Calcium: 9.7 mg/dL (ref 8.4–10.5)
Chloride: 102 mEq/L (ref 96–112)
Creatinine, Ser: 0.72 mg/dL (ref 0.40–1.20)
GFR: 88.97 mL/min (ref >60.00)
Glucose, Bld: 93 mg/dL (ref 70–99)
Potassium: 4 mEq/L (ref 3.5–5.1)
Sodium: 140 mEq/L (ref 135–145)

## 2014-11-18 LAB — LIPID PANEL
Cholesterol: 203 mg/dL — ABNORMAL HIGH (ref 0–200)
HDL: 65.6 mg/dL (ref >39.00)
LDL Cholesterol: 119 mg/dL — ABNORMAL HIGH (ref 0–99)
NonHDL: 137.7
Total CHOL/HDL Ratio: 3
Triglycerides: 94 mg/dL (ref 0.0–149.0)
VLDL: 18.8 mg/dL (ref 0.0–40.0)

## 2014-11-18 LAB — TSH: TSH: 2.06 u[IU]/mL (ref 0.35–4.50)

## 2014-11-18 NOTE — Progress Notes (Signed)
Subjective:  Patient ID: Megan Garner, female    DOB: 09-Mar-1960  Age: 54 y.o. MRN: [id number]  CC: Annual Exam   HPI Megan Garner presents for well exam  Outpatient Prescriptions Prior to Visit  Medication Sig Dispense Refill  . Calcium Carbonate-Vitamin D (CALCIUM 600 + D PO) Take 1 each by mouth daily.      . Cholecalciferol (VITAMIN D3) 1000 UNITS CAPS Take by mouth.      . citalopram (CELEXA) 20 MG tablet Take 1 tablet (20 mg total) by mouth daily. 90 tablet 1  . famotidine (PEPCID) 20 MG tablet Take 20 mg by mouth 2 (two) times daily.      Marland Kitchen loratadine (CLARITIN) 10 MG tablet Take 10 mg by mouth daily.      . Multiple Vitamins-Minerals (MULTIVITAMIN PO) Take 1 tablet by mouth daily.      . Omega-3 Fatty Acids (FISH OIL) 1000 MG CAPS Take 1 each by mouth daily.      Marland Kitchen albuterol (PROVENTIL HFA;VENTOLIN HFA) 108 (90 BASE) MCG/ACT inhaler Inhale 2 puffs into the lungs every 4 (four) hours as needed for wheezing or shortness of breath. (Patient not taking: Reported on 11/18/2014) 1 Inhaler 0  . ipratropium (ATROVENT) 0.03 % nasal spray Place 2 sprays into both nostrils 2 (two) times daily. (Patient not taking: Reported on 11/18/2014) 30 mL 0  . chlorpheniramine-HYDROcodone (TUSSIONEX PENNKINETIC ER) 10-8 MG/5ML LQCR Take 5 mLs by mouth every 12 (twelve) hours as needed for cough (cough). (Patient not taking: Reported on 11/18/2014) 100 mL 0   No facility-administered medications prior to visit.    ROS Review of Systems  Constitutional: Negative for chills, activity change, appetite change, fatigue and unexpected weight change.  HENT: Negative for congestion, mouth sores and sinus pressure.   Eyes: Negative for visual disturbance.  Respiratory: Negative for cough and chest tightness.   Gastrointestinal: Negative for nausea and abdominal pain.  Genitourinary: Negative for frequency, difficulty urinating and vaginal pain.  Musculoskeletal: Negative for back pain and gait problem.    Skin: Negative for pallor and rash.  Neurological: Negative for dizziness, tremors, weakness, numbness and headaches.  Psychiatric/Behavioral: Negative for suicidal ideas, confusion and sleep disturbance.    Objective:  BP 108/82 mmHg  Pulse 72  Ht  (1.753 m)  Wt 171 lb (77.565 kg)  BMI 25.24 kg/m2  SpO2 97%  LMP 03/24/2011  BP Readings from Last 3 Encounters:  11/18/14 108/82  01/19/14 120/72  08/27/13 102/78    Wt Readings from Last 3 Encounters:  11/18/14 171 lb (77.565 kg)  01/19/14 169 lb 9.6 oz (76.93 kg)  08/27/13 166 lb 6.4 oz (75.479 kg)    Physical Exam  Constitutional: She appears well-developed. No distress.  HENT:  Head: Normocephalic.  Right Ear: External ear normal.  Left Ear: External ear normal.  Nose: Nose normal.  Mouth/Throat: Oropharynx is clear and moist.  Eyes: Conjunctivae are normal. Pupils are equal, round, and reactive to light. Right eye exhibits no discharge. Left eye exhibits no discharge.  Neck: Normal range of motion. Neck supple. No JVD present. No tracheal deviation present. No thyromegaly present.  Cardiovascular: Normal rate, regular rhythm and normal heart sounds.   Pulmonary/Chest: No stridor. No respiratory distress. She has no wheezes.  Abdominal: Soft. Bowel sounds are normal. She exhibits no distension and no mass. There is no tenderness. There is no rebound and no guarding.  Musculoskeletal: She exhibits no edema or tenderness.  Lymphadenopathy:    She  has no cervical adenopathy.  Neurological: She displays normal reflexes. No cranial nerve deficit. She exhibits normal muscle tone. Coordination normal.  Skin: No rash noted. No erythema.  Psychiatric: She has a normal mood and affect. Her behavior is normal. Judgment and thought content normal.    Lab Results  Component Value Date   WBC 4.9 08/27/2013   HGB 13.8 08/27/2013   HCT 39.4 08/27/2013   PLT 193.0 08/27/2013   GLUCOSE 95 08/27/2013   CHOL 197 08/27/2013    TRIG 66.0 08/27/2013   HDL 69.30 08/27/2013   LDLCALC 115* 08/27/2013   ALT 16 08/27/2013   AST 22 08/27/2013   NA 136 08/27/2013   K 4.2 08/27/2013   CL 100 08/27/2013   CREATININE 0.7 08/27/2013   BUN 14 08/27/2013   CO2 29 08/27/2013   TSH 3.40 08/27/2013    Mm Screening Breast Tomo Bilateral  08/27/2013   CLINICAL DATA:  Screening.  EXAM: DIGITAL SCREENING BILATERAL MAMMOGRAM WITH 3D TOMO WITH CAD  DIGITAL BREAST TOMOSYNTHESIS  Digital breast tomosynthesis images are acquired in two projections. These images are reviewed in combination with the digital mammogram, confirming the findings below.  COMPARISON:  Previous exam(s).  ACR Breast Density Category c: The breast tissue is heterogeneously dense, which may obscure small masses.  FINDINGS: There are no findings suspicious for malignancy. Images were processed with CAD.  IMPRESSION: No mammographic evidence of malignancy. A result letter of this screening mammogram will be mailed directly to the patient.  RECOMMENDATION: Screening mammogram in one year. (Code:[id number]-01Y)  BI-RADS CATEGORY  1: Negative.   Electronically Signed   By: Anselmo Pickler M.D.   On: 08/27/2013 14:57   EKG NSR  Assessment & Plan:   Megan Garner was seen today for annual exam.  Diagnoses and all orders for this visit:  Well adult exam -     EKG 12-Lead -     Basic metabolic panel; Future -     CBC with Differential/Platelet; Future -     Lipid panel; Future -     TSH; Future -     Urinalysis; Future -     Hepatic function panel; Future -     Hepatitis C Antibody; Future  I have discontinued Megan Garner's chlorpheniramine-HYDROcodone. I am also having her maintain her Vitamin D3, Multiple Vitamins-Minerals (MULTIVITAMIN PO), Fish Oil, Calcium Carbonate-Vitamin D (CALCIUM 600 + D PO), famotidine, loratadine, ipratropium, albuterol, and citalopram.  No orders of the defined types were placed in this encounter.     Follow-up: Return in about 1 year (around  11/18/2015) for Wellness Exam.  Megan Primes, MD

## 2014-11-18 NOTE — Assessment & Plan Note (Signed)
We discussed age appropriate health related issues, including available/recomended screening tests and vaccinations. We discussed a need for adhering to healthy diet and exercise. Labs/EKG were reviewed/ordered. All questions were answered.  GYN/PAP Flu shot at [facility]

## 2014-11-18 NOTE — Progress Notes (Signed)
Pre visit review using our clinic review tool, if applicable. No additional management support is needed unless otherwise documented below in the visit note. 

## 2014-11-19 LAB — HEPATITIS C ANTIBODY: HCV Ab: NEGATIVE

## 2015-01-13 ENCOUNTER — Encounter: Payer: Self-pay | Admitting: Internal Medicine

## 2015-01-14 ENCOUNTER — Other Ambulatory Visit: Payer: Self-pay | Admitting: Internal Medicine

## 2015-01-14 MED ORDER — CITALOPRAM HYDROBROMIDE 20 MG PO TABS: 20.0000 mg | ORAL_TABLET | Freq: Every day | ORAL | Status: DC

## 2015-05-07 ENCOUNTER — Encounter (INDEPENDENT_AMBULATORY_CARE_PROVIDER_SITE_OTHER): Payer: Self-pay

## 2015-05-08 ENCOUNTER — Telehealth: Payer: Self-pay

## 2015-05-08 NOTE — Telephone Encounter (Signed)
LVM for pt to call back as soon as possible.   RE: Flu Vaccine for this flu season.   

## 2015-05-27 DIAGNOSIS — M545 Low back pain: Secondary | ICD-10-CM | POA: Diagnosis not present

## 2015-05-27 DIAGNOSIS — M9903 Segmental and somatic dysfunction of lumbar region: Secondary | ICD-10-CM | POA: Diagnosis not present

## 2015-06-30 DIAGNOSIS — M9903 Segmental and somatic dysfunction of lumbar region: Secondary | ICD-10-CM | POA: Diagnosis not present

## 2015-06-30 DIAGNOSIS — M545 Low back pain: Secondary | ICD-10-CM | POA: Diagnosis not present

## 2015-07-07 ENCOUNTER — Ambulatory Visit (INDEPENDENT_AMBULATORY_CARE_PROVIDER_SITE_OTHER): Payer: BLUE CROSS/BLUE SHIELD | Admitting: Urgent Care

## 2015-07-07 VITALS — BP 116/78 | HR 84 | Temp 98.2°F | Resp 18 | Ht 69.0 in | Wt 168.0 lb

## 2015-07-07 DIAGNOSIS — L259 Unspecified contact dermatitis, unspecified cause: Secondary | ICD-10-CM | POA: Diagnosis not present

## 2015-07-07 DIAGNOSIS — L299 Pruritus, unspecified: Secondary | ICD-10-CM | POA: Diagnosis not present

## 2015-07-07 MED ORDER — PREDNISONE 10 MG PO TABS: 10.0000 mg | ORAL_TABLET | Freq: Every day | ORAL | Status: DC

## 2015-07-07 MED ORDER — LORAZEPAM 0.5 MG PO TABS: 0.5000 mg | ORAL_TABLET | Freq: Every day | ORAL | Status: DC

## 2015-07-07 MED ORDER — PREDNISONE 20 MG PO TABS: 20.0000 mg | ORAL_TABLET | Freq: Every day | ORAL | Status: DC

## 2015-07-07 NOTE — Progress Notes (Signed)
    MRN: [id number] DOB: 12/11/1960  Subjective:   Megan Garner is a 55 y.o. female presenting for chief complaint of Allergic Reaction  Reports 4 day history pruritic rash over hands bilaterally. Patient has had this before typically with her dogs going into marsh, woods, getting wet and then interacting with the patient. She had this happen with her family's dogs this past weekend. She has since had bumpy, itchy rash over the finger tips and back of her hands. She has tried steroid creams that she's been prescribed before including triamcinolone, mometasone with minimal relief. Benadryl and icing her hands have provided very short term relief. She would like a short low dose steroid course because this has helped the most in the past. She denies fever, cough, chest tightness, shob, n/v, blistering, bleeding. Denies smoking cigarettes.  Megan Garner has a current medication list which includes the following prescription(s): albuterol, calcium carb-cholecalciferol, vitamin d3, citalopram, famotidine, ipratropium, loratadine, multiple vitamins-minerals, and fish oil. Also has No Known Allergies.  Megan Garner  has a past medical history of Asthma and Allergy. Also  has no past surgical history on file.  Objective:   Vitals: BP 116/78 mmHg  Pulse 84  Temp(Src) 98.2 F (36.8 C) (Oral)  Resp 18  Ht 5\' 9"  (1.753 m)  Wt 168 lb (76.204 kg)  BMI 24.80 kg/m2  SpO2 98%  LMP 03/24/2011  Physical Exam  Constitutional: She is oriented to person, place, and time. She appears well-developed and well-nourished.  HENT:  Mouth/Throat: Oropharynx is clear and moist.  Cardiovascular: Normal rate, regular rhythm and intact distal pulses.  Exam reveals no gallop and no friction rub.   No murmur heard. Pulmonary/Chest: No respiratory distress. She has no wheezes. She has no rales.  Neurological: She is alert and oriented to person, place, and time.  Skin: Skin is warm and dry. Rash (papular and erythematous rash over  fingertips and dorsal aspect of fingers up to MCP) noted. No erythema. No pallor.   Assessment and Plan :   1. Contact dermatitis 2. Itching - Counseled on risks of steroid use. Patient verbalized understanding. She is to rtc if rash returns despite short steroid course. Use lorazepam for sleep secondary to severe itching.  Wallis BambergMario Jorma Tassinari, PA-C [facility] [phone] 07/07/2015 8:44 AM

## 2015-07-07 NOTE — Patient Instructions (Addendum)
Contact Dermatitis Dermatitis is redness, soreness, and swelling (inflammation) of the skin. Contact dermatitis is a reaction to certain substances that touch the skin. There are two types of contact dermatitis:   Irritant contact dermatitis. This type is caused by something that irritates your skin, such as dry hands from washing them too much. This type does not require previous exposure to the substance for a reaction to occur. This type is more common.  Allergic contact dermatitis. This type is caused by a substance that you are allergic to, such as a nickel allergy or poison ivy. This type only occurs if you have been exposed to the substance (allergen) before. Upon a repeat exposure, your body reacts to the substance. This type is less common. CAUSES  Many different substances can cause contact dermatitis. Irritant contact dermatitis is most commonly caused by exposure to:   Makeup.   Soaps.   Detergents.   Bleaches.   Acids.   Metal salts, such as nickel.  Allergic contact dermatitis is most commonly caused by exposure to:   Poisonous plants.   Chemicals.   Jewelry.   Latex.   Medicines.   Preservatives in products, such as clothing.  RISK FACTORS This condition is more likely to develop in:   People who have jobs that expose them to irritants or allergens.  People who have certain medical conditions, such as asthma or eczema.  SYMPTOMS  Symptoms of this condition may occur anywhere on your body where the irritant has touched you or is touched by you. Symptoms include:  Dryness or flaking.   Redness.   Cracks.   Itching.   Pain or a burning feeling.   Blisters.  Drainage of small amounts of blood or clear fluid from skin cracks. With allergic contact dermatitis, there may also be swelling in areas such as the eyelids, mouth, or genitals.  DIAGNOSIS  This condition is diagnosed with a medical history and physical exam. A patch skin test  may be performed to help determine the cause. If the condition is related to your job, you may need to see an occupational medicine specialist. TREATMENT Treatment for this condition includes figuring out what caused the reaction and protecting your skin from further contact. Treatment may also include:   Steroid creams or ointments. Oral steroid medicines may be needed in more severe cases.  Antibiotics or antibacterial ointments, if a skin infection is present.  Antihistamine lotion or an antihistamine taken by mouth to ease itching.  A bandage (dressing). HOME CARE INSTRUCTIONS Skin Care  Moisturize your skin as needed.   Apply cool compresses to the affected areas.  Try taking a bath with:  Epsom salts. Follow the instructions on the packaging. You can get these at your local pharmacy or grocery store.  Baking soda. Pour a small amount into the bath as directed by your health care provider.  Colloidal oatmeal. Follow the instructions on the packaging. You can get this at your local pharmacy or grocery store.  Try applying baking soda paste to your skin. Stir water into baking soda until it reaches a paste-like consistency.  Do not scratch your skin.  Bathe less frequently, such as every other day.  Bathe in lukewarm water. Avoid using hot water. Medicines  Take or apply over-the-counter and prescription medicines only as told by your health care provider.   If you were prescribed an antibiotic medicine, take or apply your antibiotic as told by your health care provider. Do not stop using the   antibiotic even if your condition starts to improve. General Instructions  Keep all follow-up visits as told by your health care provider. This is important.  Avoid the substance that caused your reaction. If you do not know what caused it, keep a journal to try to track what caused it. Write down:  What you eat.  What cosmetic products you use.  What you drink.  What  you wear in the affected area. This includes jewelry.  If you were given a dressing, take care of it as told by your health care provider. This includes when to change and remove it. SEEK MEDICAL CARE IF:   Your condition does not improve with treatment.  Your condition gets worse.  You have signs of infection such as swelling, tenderness, redness, soreness, or warmth in the affected area.  You have a fever.  You have new symptoms. SEEK IMMEDIATE MEDICAL CARE IF:   You have a severe headache, neck pain, or neck stiffness.  You vomit.  You feel very sleepy.  You notice red streaks coming from the affected area.  Your bone or joint underneath the affected area becomes painful after the skin has healed.  The affected area turns darker.  You have difficulty breathing.   This information is not intended to replace advice given to you by your health care provider. Make sure you discuss any questions you have with your health care provider.   Document Released: 01/22/2000 Document Revised: 10/15/2014 Document Reviewed: 06/11/2014 Elsevier Interactive Patient Education 2016 [employer]     IF you received an x-ray today, you will receive an invoice from [facility]. Please contact [facility] at [phone] with questions or concerns regarding your invoice.   IF you received labwork today, you will receive an invoice from [facility]. Please contact [facility] at [phone] with questions or concerns regarding your invoice.   Our billing staff will not be able to assist you with questions regarding bills from these companies.  You will be contacted with the lab results as soon as they are available. The fastest way to get your results is to activate your My Chart account. Instructions are located on the last page of this paperwork. If you have not heard from us regarding the results in 2 weeks, please contact this office.    We  recommend that you schedule a mammogram for breast cancer screening. Typically, you do not need a referral to do this. Please contact a local imaging center to schedule your mammogram.  [facility] - [phone]  *ask for the Radiology Department [facility]) - [phone] or [phone]  [facility] - [phone] [facility] - [phone] [facility] - [phone]  *ask for the Radiology Department [facility] - [phone]  *ask for the Radiology Department [facility] - [phone]  *ask for the Mammography Department [facility] - [phone]

## 2015-07-29 DIAGNOSIS — M9903 Segmental and somatic dysfunction of lumbar region: Secondary | ICD-10-CM | POA: Diagnosis not present

## 2015-07-29 DIAGNOSIS — M545 Low back pain: Secondary | ICD-10-CM | POA: Diagnosis not present

## 2015-07-30 ENCOUNTER — Other Ambulatory Visit: Payer: Self-pay | Admitting: Internal Medicine

## 2015-09-09 DIAGNOSIS — M545 Low back pain: Secondary | ICD-10-CM | POA: Diagnosis not present

## 2015-09-09 DIAGNOSIS — M9903 Segmental and somatic dysfunction of lumbar region: Secondary | ICD-10-CM | POA: Diagnosis not present

## 2015-10-28 DIAGNOSIS — M9903 Segmental and somatic dysfunction of lumbar region: Secondary | ICD-10-CM | POA: Diagnosis not present

## 2015-10-28 DIAGNOSIS — M545 Low back pain: Secondary | ICD-10-CM | POA: Diagnosis not present

## 2015-11-12 DIAGNOSIS — B009 Herpesviral infection, unspecified: Secondary | ICD-10-CM | POA: Diagnosis not present

## 2015-11-12 DIAGNOSIS — N841 Polyp of cervix uteri: Secondary | ICD-10-CM | POA: Diagnosis not present

## 2015-11-12 DIAGNOSIS — Z01419 Encounter for gynecological examination (general) (routine) without abnormal findings: Secondary | ICD-10-CM | POA: Diagnosis not present

## 2015-11-12 DIAGNOSIS — Z124 Encounter for screening for malignant neoplasm of cervix: Secondary | ICD-10-CM | POA: Diagnosis not present

## 2015-11-12 DIAGNOSIS — Z130 Encounter for screening for diseases of the blood and blood-forming organs and certain disorders involving the immune mechanism: Secondary | ICD-10-CM | POA: Diagnosis not present

## 2015-11-12 DIAGNOSIS — Z1389 Encounter for screening for other disorder: Secondary | ICD-10-CM | POA: Diagnosis not present

## 2015-11-12 DIAGNOSIS — Z1231 Encounter for screening mammogram for malignant neoplasm of breast: Secondary | ICD-10-CM | POA: Diagnosis not present

## 2015-11-12 DIAGNOSIS — N952 Postmenopausal atrophic vaginitis: Secondary | ICD-10-CM | POA: Diagnosis not present

## 2015-11-12 DIAGNOSIS — Z780 Asymptomatic menopausal state: Secondary | ICD-10-CM | POA: Diagnosis not present

## 2015-11-12 DIAGNOSIS — Z1151 Encounter for screening for human papillomavirus (HPV): Secondary | ICD-10-CM | POA: Diagnosis not present

## 2015-12-09 DIAGNOSIS — M9903 Segmental and somatic dysfunction of lumbar region: Secondary | ICD-10-CM | POA: Diagnosis not present

## 2015-12-09 DIAGNOSIS — M545 Low back pain: Secondary | ICD-10-CM | POA: Diagnosis not present

## 2016-01-02 IMAGING — MG MM SCREENING BREAST TOMO BILATERAL
8 of 12 series · 8 of 28 positions shown · non-contrast
Comparison: Previous exam(s).

CLINICAL DATA: Screening.

EXAM:
DIGITAL SCREENING BILATERAL MAMMOGRAM WITH 3D TOMO WITH CAD
DIGITAL BREAST TOMOSYNTHESIS
Digital breast tomosynthesis images are acquired in two projections.
These images are reviewed in combination with the digital mammogram,
confirming the findings below.

[L CC]
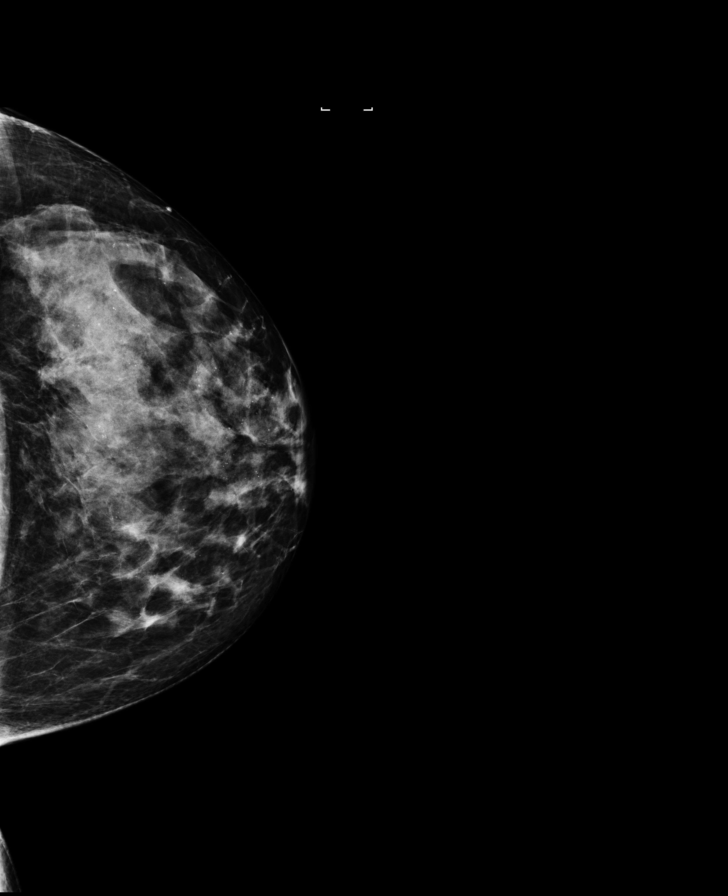

[R MLO synth-2D]
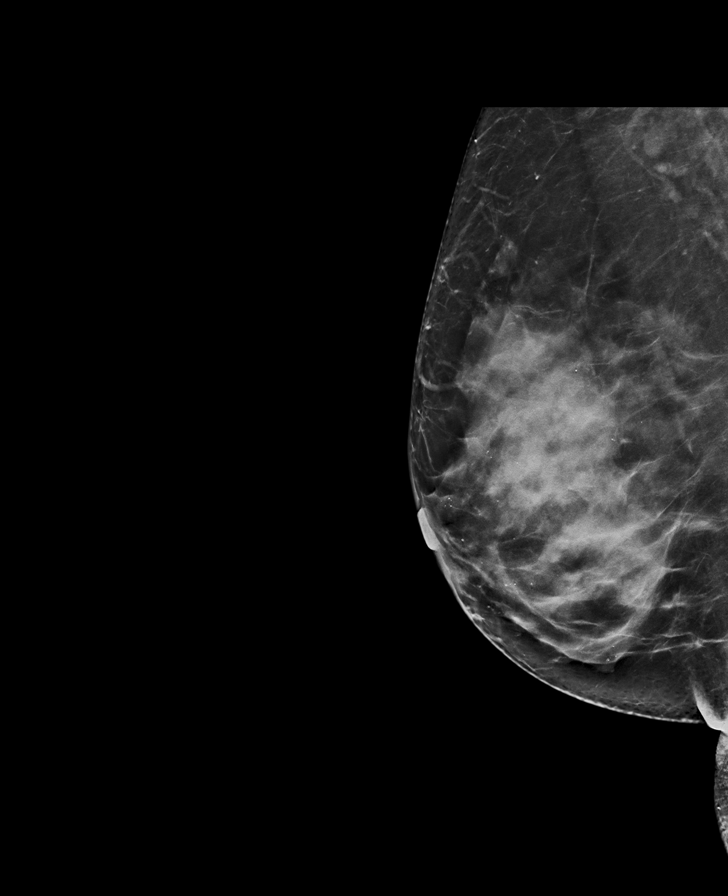

[R CC]
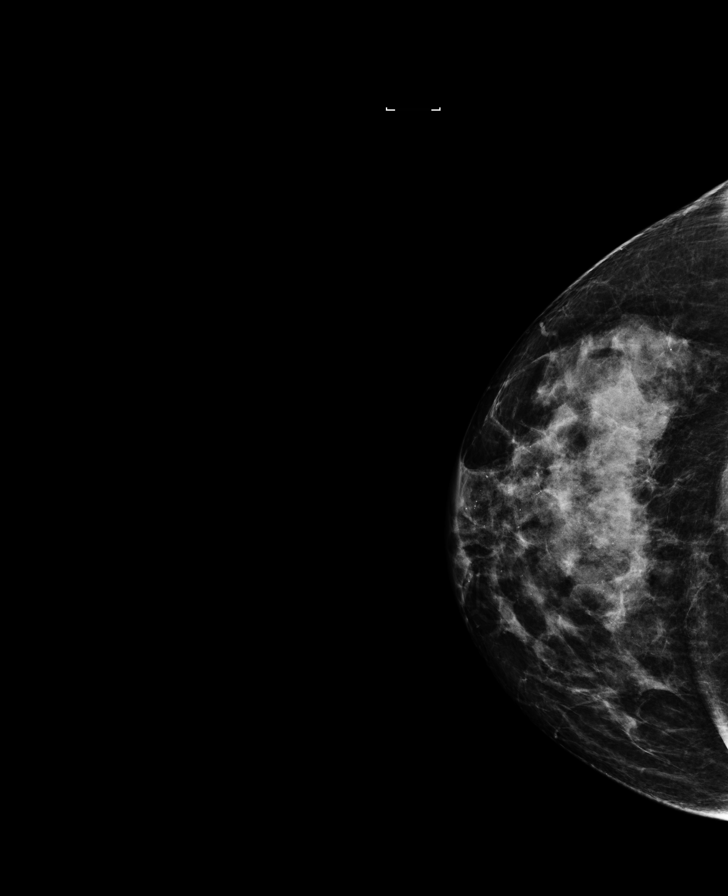

[L CC synth-2D]
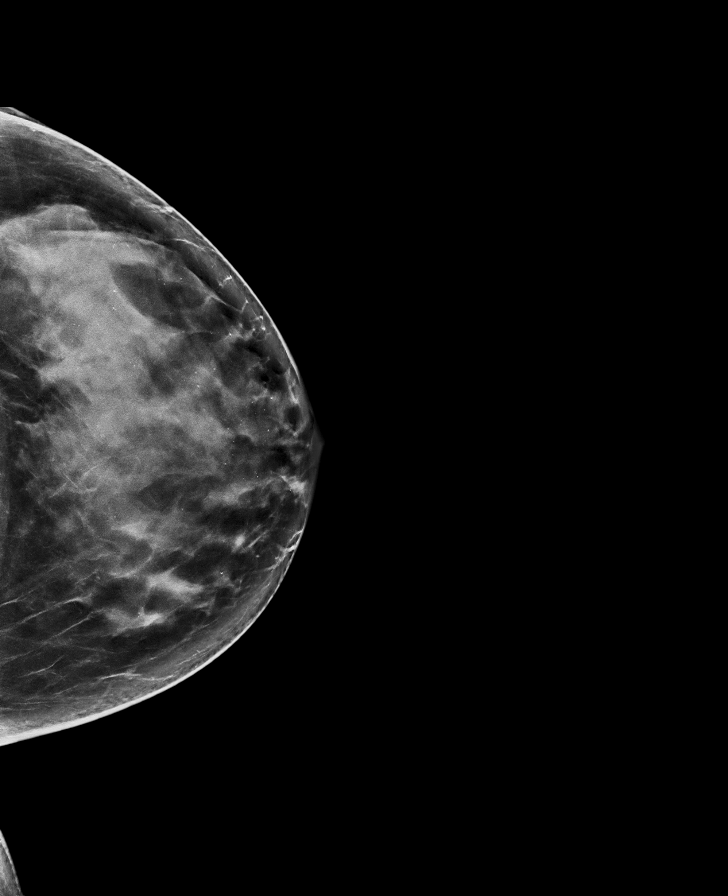

[L MLO synth-2D]
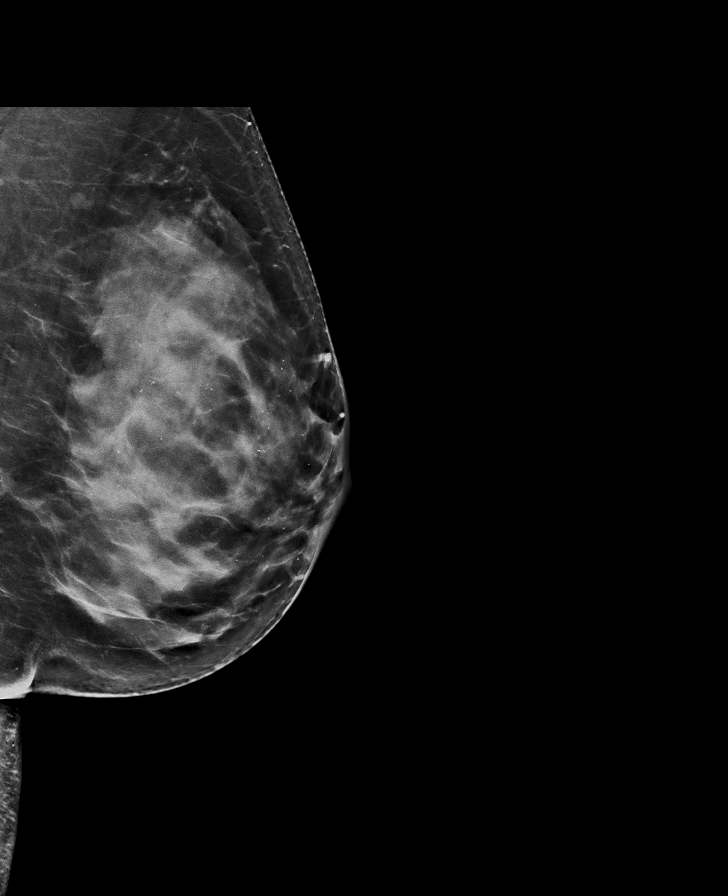

[R MLO]
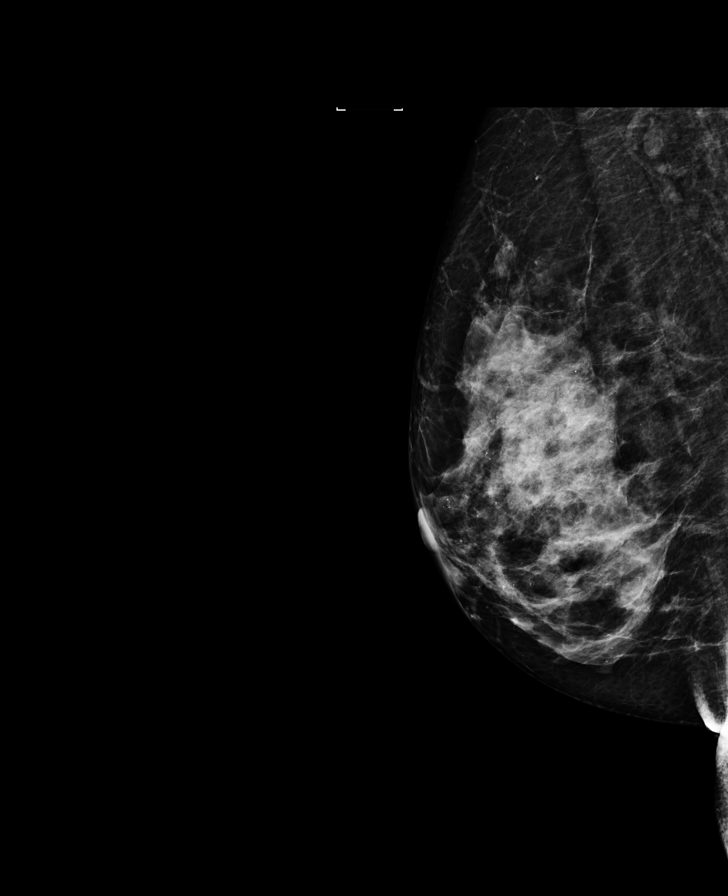

[L MLO]
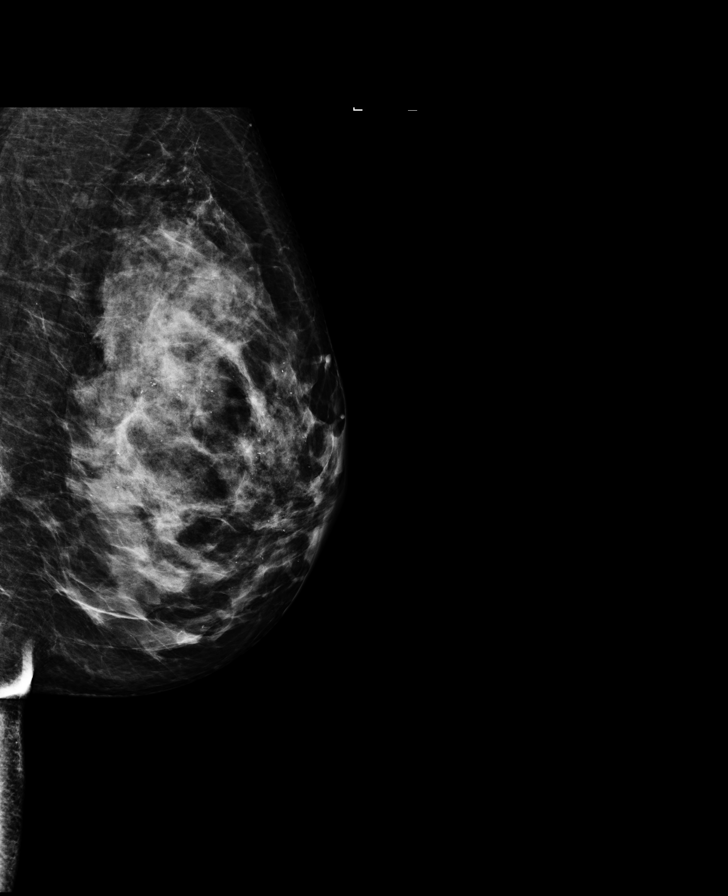

[R CC synth-2D]
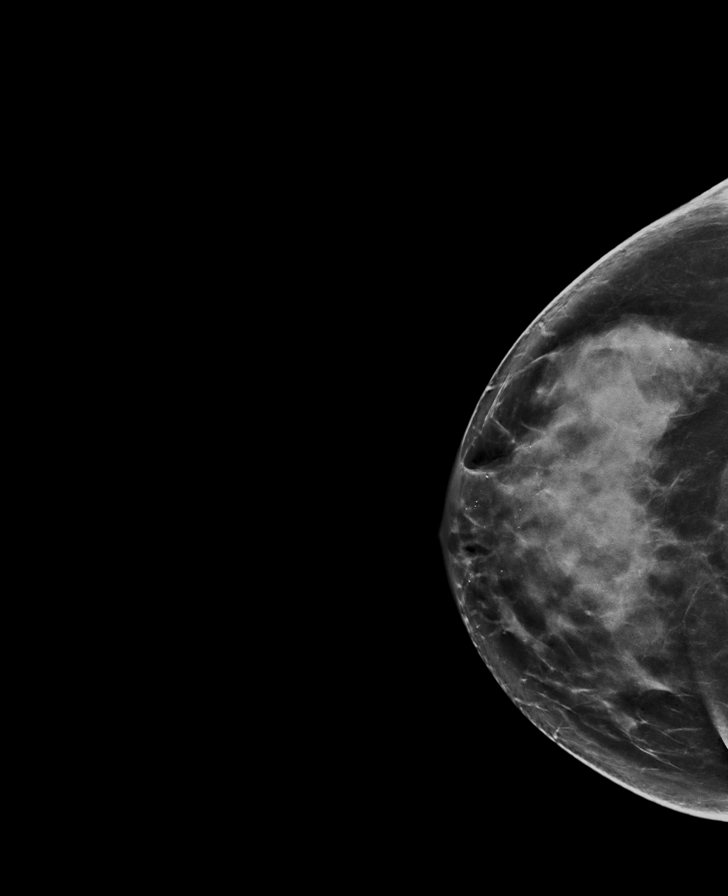

[8 of 28 positions shown; findings below may reference images not displayed]

ACR Breast Density Category c: The breast tissue is heterogeneously
dense, which may obscure small masses.
FINDINGS: There are no findings suspicious for malignancy. Images were
processed with CAD.
IMPRESSION: No mammographic evidence of malignancy. A result letter of this
screening mammogram will be mailed directly to the patient.

RECOMMENDATION:
Screening mammogram in one year. (Code:[id number])

BI-RADS CATEGORY  1: Negative.

## 2016-01-14 DIAGNOSIS — M9903 Segmental and somatic dysfunction of lumbar region: Secondary | ICD-10-CM | POA: Diagnosis not present

## 2016-01-14 DIAGNOSIS — M545 Low back pain: Secondary | ICD-10-CM | POA: Diagnosis not present

## 2016-01-18 DIAGNOSIS — N888 Other specified noninflammatory disorders of cervix uteri: Secondary | ICD-10-CM | POA: Diagnosis not present

## 2016-01-18 DIAGNOSIS — R87619 Unspecified abnormal cytological findings in specimens from cervix uteri: Secondary | ICD-10-CM | POA: Diagnosis not present

## 2016-02-17 DIAGNOSIS — M545 Low back pain: Secondary | ICD-10-CM | POA: Diagnosis not present

## 2016-02-17 DIAGNOSIS — M9903 Segmental and somatic dysfunction of lumbar region: Secondary | ICD-10-CM | POA: Diagnosis not present

## 2016-02-23 ENCOUNTER — Other Ambulatory Visit: Payer: Self-pay | Admitting: Internal Medicine

## 2016-02-23 NOTE — Telephone Encounter (Signed)
Routing to dr plotnikov---patient's last office visit was oct/2016---are you ok with refilling---please advise, thanks

## 2016-03-28 DIAGNOSIS — M545 Low back pain: Secondary | ICD-10-CM | POA: Diagnosis not present

## 2016-03-28 DIAGNOSIS — M9903 Segmental and somatic dysfunction of lumbar region: Secondary | ICD-10-CM | POA: Diagnosis not present

## 2016-04-11 ENCOUNTER — Other Ambulatory Visit: Payer: Self-pay | Admitting: Physician Assistant

## 2016-04-11 MED ORDER — ALBUTEROL SULFATE HFA 108 (90 BASE) MCG/ACT IN AERS: 2.0000 | INHALATION_SPRAY | RESPIRATORY_TRACT | 0 refills | Status: DC | PRN

## 2016-04-12 ENCOUNTER — Ambulatory Visit (INDEPENDENT_AMBULATORY_CARE_PROVIDER_SITE_OTHER): Payer: BLUE CROSS/BLUE SHIELD | Admitting: Physician Assistant

## 2016-04-12 VITALS — BP 122/78 | HR 79 | Temp 98.2°F | Resp 18 | Ht 69.0 in | Wt 172.0 lb

## 2016-04-12 DIAGNOSIS — J22 Unspecified acute lower respiratory infection: Secondary | ICD-10-CM | POA: Diagnosis not present

## 2016-04-12 MED ORDER — PREDNISONE 20 MG PO TABS: ORAL_TABLET | ORAL | 0 refills | Status: AC

## 2016-04-12 MED ORDER — AZITHROMYCIN 250 MG PO TABS: ORAL_TABLET | ORAL | 0 refills | Status: DC

## 2016-04-12 MED ORDER — BENZONATATE 100 MG PO CAPS: 100.0000 mg | ORAL_CAPSULE | Freq: Three times a day (TID) | ORAL | 0 refills | Status: DC | PRN

## 2016-04-12 MED ORDER — HYDROCOD POLST-CPM POLST ER 10-8 MG/5ML PO SUER: 5.0000 mL | Freq: Every evening | ORAL | 0 refills | Status: DC | PRN

## 2016-04-12 NOTE — Progress Notes (Signed)
[facility] [address], [place] [address] [phone]  Date:  04/12/2016   Name:  Megan SprayLaura Garner   DOB:  01/13/1961   MRN:  [id number]  PCP:  Sonda PrimesAlex Plotnikov, MD    History of Present Illness:  Megan SprayLaura Garner is a 56 y.o. female patient [facility].  10 days ago, she was developing fatigue, sore throat, and congestion.  Symptoms wax and wane.  She is coughing.  Throughout the last 3 days, she is progressing with coughing, burning sensation with chest,  .  She is coughing productive clear phlegm.  She is wheezing.  No fevers.  Mild body aches.  No ear pain.   She has been using  albuterol every 4 hours.  She has used mucinex dm which helps mildly.  Thins the mucus.     Patient Active Problem List   Diagnosis Date Noted  . Well adult exam 08/27/2013  . Warts 04/24/2011  . UPPER RESPIRATORY INFECTION (URI) 02/13/2007  . ALLERGIC RHINITIS 02/13/2007  . ASTHMA 02/13/2007    Past Medical History:  Diagnosis Date  . Allergy    cyclical cough  . Asthma     No past surgical history on file.  Social History  Substance Use Topics  . Smoking status: Never Smoker  . Smokeless tobacco: Never Used  . Alcohol use 6.0 oz/week    10 Glasses of wine per week    Family History  Problem Relation Age of Onset  . Hypertension Other   . Hypertension Mother   . Arthritis Mother     gout  . Cancer Father [id number]    colon ca    No Known Allergies  Medication list has been reviewed and updated.  Current Outpatient Prescriptions on File Prior to Visit  Medication Sig Dispense Refill  . albuterol (PROVENTIL HFA;VENTOLIN HFA) 108 (90 Base) MCG/ACT inhaler Inhale 2 puffs into the lungs every 4 (four) hours as needed for wheezing or shortness of breath. 1 Inhaler 0  . Calcium Carbonate-Vitamin D (CALCIUM 600 + D PO) Take 1 each by mouth daily.      . Cholecalciferol (VITAMIN D3) 1000 UNITS CAPS Take by mouth.      . citalopram (CELEXA) 20 MG  tablet TAKE 1 TABLET BY MOUTH ONCE A DAY 90 tablet 1  . famotidine (PEPCID) 20 MG tablet Take 20 mg by mouth 2 (two) times daily.      Marland Kitchen. ipratropium (ATROVENT) 0.03 % nasal Garner Place 2 sprays into both nostrils 2 (two) times daily. (Patient not taking: Reported on 04/12/2016) 30 mL 0  . loratadine (CLARITIN) 10 MG tablet Take 10 mg by mouth daily.      Marland Kitchen. LORazepam (ATIVAN) 0.5 MG tablet Take 1 tablet (0.5 mg total) by mouth at bedtime. (Patient not taking: Reported on 04/12/2016) 5 tablet 0  . Multiple Vitamins-Minerals (MULTIVITAMIN PO) Take 1 tablet by mouth daily.      . Omega-3 Fatty Acids (FISH OIL) 1000 MG CAPS Take 1 each by mouth daily.       No current facility-administered medications on file prior to visit.     ROS ROS otherwise unremarkable unless listed above.  Physical Examination: BP 122/78   Pulse 79   Temp 98.2 F (36.8 C) (Oral)   Resp 18   Ht 5\' 9"  (1.753 m)   Wt 172 lb (78 kg)   LMP 03/24/2011   SpO2 97%   BMI 25.40  kg/m  Ideal Body Weight: Weight in (lb) to have BMI = 25: 168.9  Physical Exam  Constitutional: She is oriented to person, place, and time. She appears well-developed and well-nourished. No distress.  HENT:  Head: Normocephalic and atraumatic.  Right Ear: External ear normal.  Left Ear: External ear normal.  Eyes: Conjunctivae and EOM are normal. Pupils are equal, round, and reactive to light.  Cardiovascular: Normal rate, regular rhythm and intact distal pulses.  Exam reveals no friction rub.   No murmur heard. Pulmonary/Chest: Effort normal. No respiratory distress. She has no decreased breath sounds. She has no wheezes. She has rhonchi.  Neurological: She is alert and oriented to person, place, and time.  Skin: She is not diaphoretic.  Psychiatric: She has a normal mood and affect. Her behavior is normal.   Assessment and Plan: Megan Garner is a 56 y.o. female who is here today for cough and wheezing. Will treat for possible bacterial  etiology of LRI. Supportive treatment given as well. Lower respiratory infection (e.g., bronchitis, pneumonia, pneumonitis, pulmonitis) - Plan: azithromycin (ZITHROMAX) 250 MG tablet, predniSONE (DELTASONE) 20 MG tablet, benzonatate (TESSALON) 100 MG capsule, chlorpheniramine-HYDROcodone (TUSSIONEX PENNKINETIC ER) 10-8 MG/5ML SUER  Trena Platt, PA-C [facility] 3/19/20189:39 PM

## 2016-04-12 NOTE — Patient Instructions (Addendum)
Continue to hydrate well. Start prednisone tomorrow morning. This will keep you up at night. Tussionex has side effects of sedation so be careful with getting up and down out of the bed. Please let us know if you're not having any improvement. If this is a bronchitis, be aware that the coughing can last for weeks to months.    Bronchospasm, Adult Bronchospasm is when airways in the lungs get smaller. When this happens, it can be hard to breathe. You may cough. You may also make a whistling sound when you breathe (wheeze). Follow these instructions at home: Medicines   Take over-the-counter and prescription medicines only as told by your doctor.  If you need to use an inhaler or nebulizer to take your medicine, ask your doctor how to use it.  If you were given a spacer, always use it with your inhaler. Lifestyle   Change your heating and air conditioning filter. Do this at least once a month.  Try not to use fireplaces and wood stoves.  Do not  smoke. Do not  allow smoking in your home.  Try not to use things that have a strong smell, like perfume.  Get rid of pests (such as roaches and mice) and their poop.  Remove any mold from your home.  Keep your house clean. Get rid of dust.  Use cleaning products that have no smell.  Replace carpet with wood, tile, or vinyl flooring.  Use allergy-proof pillows, mattress covers, and box spring covers.  Wash bed sheets and blankets every week. Use hot water. Dry them in a dryer.  Use blankets that are made of polyester or cotton.  Wash your hands often.  Keep pets out of your bedroom.  When you exercise, try not to breathe in cold air. General instructions   Have a plan for getting medical care. Know these things:  When to call your doctor.  When to call local emergency services (911 in the [place]  Where to go in an emergency.  Stay up to date on your shots (immunizations).  When you have an episode:  Stay  calm.  Relax.  Breathe slowly. Contact a doctor if:  Your muscles ache.  Your chest hurts.  The color of the mucus you cough up (sputum) changes from clear or white to yellow, green, gray, or bloody.  The mucus you cough up gets thicker.  You have a fever. Get help right away if:  The whistling sound gets worse, even after you take your medicines.  Your coughing gets worse.  You find it even harder to breathe.  Your chest hurts very much. Summary  Bronchospasm is when airways in the lungs get smaller.  When this happens, it can be hard to breathe. You may cough. You may also make a whistling sound when you breathe.  Stay away from things that cause you to have episodes. These include smoke or dust. This information is not intended to replace advice given to you by your health care provider. Make sure you discuss any questions you have with your health care provider. Document Released: 11/21/2008 Document Revised: 01/28/2016 Document Reviewed: 01/28/2016 Elsevier Interactive Patient Education  2017 [employer]   IF you received an x-ray today, you will receive an invoice from [facility]. Please contact [facility] at [phone] with questions or concerns regarding your invoice.   IF you received labwork today, you will receive an invoice from [facility]. Please contact [facility] at [phone] with questions or concerns regarding your  invoice.   Our billing staff will not be able to assist you with questions regarding bills from these companies.  You will be contacted with the lab results as soon as they are available. The fastest way to get your results is to activate your My Chart account. Instructions are located on the last page of this paperwork. If you have not heard from [place] regarding the results in 2 weeks, please contact this office.

## 2016-04-25 ENCOUNTER — Ambulatory Visit: Payer: Self-pay | Admitting: Internal Medicine

## 2016-04-25 ENCOUNTER — Telehealth: Payer: Self-pay | Admitting: Internal Medicine

## 2016-04-25 NOTE — Telephone Encounter (Signed)
The patient was made aware of the $50 cancellation fee and she stated that she made the appointment on Friday and her bronchitis went away over the weekend and wasn't able to cancel the appointment over mychart over the weekend.

## 2016-05-03 DIAGNOSIS — M9903 Segmental and somatic dysfunction of lumbar region: Secondary | ICD-10-CM | POA: Diagnosis not present

## 2016-05-03 DIAGNOSIS — M545 Low back pain: Secondary | ICD-10-CM | POA: Diagnosis not present

## 2016-06-09 DIAGNOSIS — M9903 Segmental and somatic dysfunction of lumbar region: Secondary | ICD-10-CM | POA: Diagnosis not present

## 2016-06-09 DIAGNOSIS — M545 Low back pain: Secondary | ICD-10-CM | POA: Diagnosis not present

## 2016-07-12 DIAGNOSIS — M545 Low back pain: Secondary | ICD-10-CM | POA: Diagnosis not present

## 2016-07-12 DIAGNOSIS — M9903 Segmental and somatic dysfunction of lumbar region: Secondary | ICD-10-CM | POA: Diagnosis not present

## 2016-08-16 DIAGNOSIS — M545 Low back pain: Secondary | ICD-10-CM | POA: Diagnosis not present

## 2016-08-16 DIAGNOSIS — M9903 Segmental and somatic dysfunction of lumbar region: Secondary | ICD-10-CM | POA: Diagnosis not present

## 2016-09-13 DIAGNOSIS — M9903 Segmental and somatic dysfunction of lumbar region: Secondary | ICD-10-CM | POA: Diagnosis not present

## 2016-09-13 DIAGNOSIS — M545 Low back pain: Secondary | ICD-10-CM | POA: Diagnosis not present

## 2016-09-17 ENCOUNTER — Other Ambulatory Visit: Payer: Self-pay | Admitting: Internal Medicine

## 2016-10-25 DIAGNOSIS — M9903 Segmental and somatic dysfunction of lumbar region: Secondary | ICD-10-CM | POA: Diagnosis not present

## 2016-10-25 DIAGNOSIS — M545 Low back pain: Secondary | ICD-10-CM | POA: Diagnosis not present

## 2016-11-21 DIAGNOSIS — M9903 Segmental and somatic dysfunction of lumbar region: Secondary | ICD-10-CM | POA: Diagnosis not present

## 2016-11-21 DIAGNOSIS — M545 Low back pain: Secondary | ICD-10-CM | POA: Diagnosis not present

## 2016-11-25 DIAGNOSIS — M9903 Segmental and somatic dysfunction of lumbar region: Secondary | ICD-10-CM | POA: Diagnosis not present

## 2016-11-25 DIAGNOSIS — M545 Low back pain: Secondary | ICD-10-CM | POA: Diagnosis not present

## 2016-12-28 DIAGNOSIS — M9903 Segmental and somatic dysfunction of lumbar region: Secondary | ICD-10-CM | POA: Diagnosis not present

## 2016-12-28 DIAGNOSIS — M545 Low back pain: Secondary | ICD-10-CM | POA: Diagnosis not present

## 2017-01-06 ENCOUNTER — Ambulatory Visit (INDEPENDENT_AMBULATORY_CARE_PROVIDER_SITE_OTHER): Payer: BLUE CROSS/BLUE SHIELD | Admitting: Internal Medicine

## 2017-01-06 ENCOUNTER — Encounter: Payer: Self-pay | Admitting: Internal Medicine

## 2017-01-06 DIAGNOSIS — F419 Anxiety disorder, unspecified: Secondary | ICD-10-CM

## 2017-01-06 MED ORDER — CITALOPRAM HYDROBROMIDE 20 MG PO TABS: 20.0000 mg | ORAL_TABLET | Freq: Every day | ORAL | 3 refills | Status: DC

## 2017-01-06 NOTE — Progress Notes (Signed)
Subjective:  Patient ID: Megan SprayLaura Kettlewell, female    DOB: 07/16/1960  Age: 56 y.o. MRN: [id number]  CC: No chief complaint on file.   HPI Megan SprayLaura Cumpton presents for anxiety f/u  Outpatient Medications Prior to Visit  Medication Sig Dispense Refill  . Calcium Carbonate-Vitamin D (CALCIUM 600 + D PO) Take 1 each by mouth daily.      . Cholecalciferol (VITAMIN D3) 1000 UNITS CAPS Take by mouth.      . citalopram (CELEXA) 20 MG tablet TAKE 1 TABLET BY MOUTH ONCE A DAY 90 tablet 1  . famotidine (PEPCID) 20 MG tablet Take 20 mg by mouth 2 (two) times daily.      . Flaxseed, Linseed, (FLAX SEED OIL PO) Take by mouth.    . Omega-3 Fatty Acids (FISH OIL) 1000 MG CAPS Take 1 each by mouth daily.      Marland Kitchen. albuterol (PROVENTIL HFA;VENTOLIN HFA) 108 (90 Base) MCG/ACT inhaler Inhale 2 puffs into the lungs every 4 (four) hours as needed for wheezing or shortness of breath. 1 Inhaler 0  . azithromycin (ZITHROMAX) 250 MG tablet Take 2 tabs PO x 1 dose, then 1 tab PO QD x 4 days 6 tablet 0  . benzonatate (TESSALON) 100 MG capsule Take 1-2 capsules (100-200 mg total) by mouth 3 (three) times daily as needed for cough. 40 capsule 0  . chlorpheniramine-HYDROcodone (TUSSIONEX PENNKINETIC ER) 10-8 MG/5ML SUER Take 5 mLs by mouth at bedtime as needed. 60 mL 0  . ipratropium (ATROVENT) 0.03 % nasal [place] 2 sprays into both nostrils 2 (two) times daily. (Patient not taking: Reported on 04/12/2016) 30 mL 0  . loratadine (CLARITIN) 10 MG tablet Take 10 mg by mouth daily.      Marland Kitchen. LORazepam (ATIVAN) 0.5 MG tablet Take 1 tablet (0.5 mg total) by mouth at bedtime. (Patient not taking: Reported on 04/12/2016) 5 tablet 0  . Multiple Vitamins-Minerals (MULTIVITAMIN PO) Take 1 tablet by mouth daily.       No facility-administered medications prior to visit.     ROS Review of Systems  Constitutional: Negative for activity change, appetite change, chills, fatigue and unexpected weight change.  HENT: Negative for congestion,  mouth sores and sinus pressure.   Eyes: Negative for visual disturbance.  Respiratory: Negative for cough and chest tightness.   Gastrointestinal: Negative for abdominal pain and nausea.  Genitourinary: Negative for difficulty urinating, frequency and vaginal pain.  Musculoskeletal: Negative for back pain and gait problem.  Skin: Negative for pallor and rash.  Neurological: Negative for dizziness, tremors, weakness, numbness and headaches.  Psychiatric/Behavioral: Negative for confusion, sleep disturbance and suicidal ideas.    Objective:  BP 118/82 (BP Location: Left Arm, Patient Position: Sitting, Cuff Size: Large)   Pulse 68   Temp 98 F (36.7 C) (Oral)   Ht 5\' 9"  (1.753 m)   Wt 174 lb (78.9 kg)   LMP 03/24/2011   SpO2 99%   BMI 25.70 kg/m   BP Readings from Last 3 Encounters:  01/06/17 118/82  04/12/16 122/78  07/07/15 116/78    Wt Readings from Last 3 Encounters:  01/06/17 174 lb (78.9 kg)  04/12/16 172 lb (78 kg)  07/07/15 168 lb (76.2 kg)    Physical Exam  Constitutional: She appears well-developed and well-nourished. No distress.  Psychiatric: She has a normal mood and affect. Her behavior is normal. Judgment and thought content normal.    Lab Results  Component Value Date   WBC 4.0 11/18/2014  HGB 13.8 11/18/2014   HCT 40.3 11/18/2014   PLT 191.0 11/18/2014   GLUCOSE 93 11/18/2014   CHOL 203 (H) 11/18/2014   TRIG 94.0 11/18/2014   HDL 65.60 11/18/2014   LDLCALC 119 (H) 11/18/2014   ALT 14 11/18/2014   AST 18 11/18/2014   NA 140 11/18/2014   K 4.0 11/18/2014   CL 102 11/18/2014   CREATININE 0.72 11/18/2014   BUN 14 11/18/2014   CO2 28 11/18/2014   TSH 2.06 11/18/2014    Mm Screening Breast Tomo Bilateral  Result Date: 08/27/2013 CLINICAL DATA:  Screening. EXAM: DIGITAL SCREENING BILATERAL MAMMOGRAM WITH 3D TOMO WITH CAD DIGITAL BREAST TOMOSYNTHESIS Digital breast tomosynthesis images are acquired in two projections. These images are reviewed in  combination with the digital mammogram, confirming the findings below. COMPARISON:  Previous exam(s). ACR Breast Density Category c: The breast tissue is heterogeneously dense, which may obscure small masses. FINDINGS: There are no findings suspicious for malignancy. Images were processed with CAD. IMPRESSION: No mammographic evidence of malignancy. A result letter of this screening mammogram will be mailed directly to the patient. RECOMMENDATION: Screening mammogram in one year. (Code:SM-B-01Y) BI-RADS CATEGORY  1: Negative. Electronically Signed   By: Anselmo Picklerandy  Jackson M.D.   On: 08/27/2013 14:57    Assessment & Plan:   There are no diagnoses linked to this encounter. I have discontinued Vergia AlbertsLaura Masri's Multiple Vitamins-Minerals (MULTIVITAMIN PO), loratadine, ipratropium, LORazepam, albuterol, azithromycin, benzonatate, and chlorpheniramine-HYDROcodone. I am also having her maintain her Vitamin D3, Fish Oil, Calcium Carbonate-Vitamin D (CALCIUM 600 + D PO), famotidine, citalopram, and (Flaxseed, Linseed, (FLAX SEED OIL PO)).  No orders of the defined types were placed in this encounter.    Follow-up: No Follow-up on file.  Sonda PrimesAlex Eleanora Guinyard, MD

## 2017-01-06 NOTE — Assessment & Plan Note (Signed)
Lexapro 

## 2017-01-24 DIAGNOSIS — Z01419 Encounter for gynecological examination (general) (routine) without abnormal findings: Secondary | ICD-10-CM | POA: Diagnosis not present

## 2017-01-24 DIAGNOSIS — M9903 Segmental and somatic dysfunction of lumbar region: Secondary | ICD-10-CM | POA: Diagnosis not present

## 2017-01-24 DIAGNOSIS — Z6825 Body mass index (BMI) 25.0-25.9, adult: Secondary | ICD-10-CM | POA: Diagnosis not present

## 2017-01-24 DIAGNOSIS — Z780 Asymptomatic menopausal state: Secondary | ICD-10-CM | POA: Diagnosis not present

## 2017-01-24 DIAGNOSIS — M545 Low back pain: Secondary | ICD-10-CM | POA: Diagnosis not present

## 2017-01-24 DIAGNOSIS — N952 Postmenopausal atrophic vaginitis: Secondary | ICD-10-CM | POA: Diagnosis not present

## 2017-01-24 DIAGNOSIS — Z1389 Encounter for screening for other disorder: Secondary | ICD-10-CM | POA: Diagnosis not present

## 2017-01-24 DIAGNOSIS — R87810 Cervical high risk human papillomavirus (HPV) DNA test positive: Secondary | ICD-10-CM | POA: Diagnosis not present

## 2017-01-24 DIAGNOSIS — Z1151 Encounter for screening for human papillomavirus (HPV): Secondary | ICD-10-CM | POA: Diagnosis not present

## 2017-01-24 DIAGNOSIS — Z130 Encounter for screening for diseases of the blood and blood-forming organs and certain disorders involving the immune mechanism: Secondary | ICD-10-CM | POA: Diagnosis not present

## 2017-01-24 DIAGNOSIS — Z1231 Encounter for screening mammogram for malignant neoplasm of breast: Secondary | ICD-10-CM | POA: Diagnosis not present

## 2017-01-24 DIAGNOSIS — Z124 Encounter for screening for malignant neoplasm of cervix: Secondary | ICD-10-CM | POA: Diagnosis not present

## 2017-02-09 ENCOUNTER — Encounter: Payer: Self-pay | Admitting: Internal Medicine

## 2017-02-09 ENCOUNTER — Other Ambulatory Visit: Payer: Self-pay | Admitting: Internal Medicine

## 2017-02-09 MED ORDER — LORAZEPAM 1 MG PO TABS: 0.5000 mg | ORAL_TABLET | Freq: Two times a day (BID) | ORAL | 0 refills | Status: DC | PRN

## 2017-02-09 NOTE — Telephone Encounter (Signed)
Copied from CRM [phone]. Topic: Quick Communication - Rx Refill/Question >> Feb 09, 2017  4:24 PM Alexander BergeronBarksdale, Harvey B wrote: Pt called to check the status of her mychart message for a Rx request, see the telephone note from 1.3.19 9:31am, contact pt to advise

## 2017-02-24 ENCOUNTER — Other Ambulatory Visit (INDEPENDENT_AMBULATORY_CARE_PROVIDER_SITE_OTHER): Payer: BLUE CROSS/BLUE SHIELD

## 2017-02-24 ENCOUNTER — Encounter: Payer: Self-pay | Admitting: Internal Medicine

## 2017-02-24 ENCOUNTER — Ambulatory Visit (INDEPENDENT_AMBULATORY_CARE_PROVIDER_SITE_OTHER): Payer: BLUE CROSS/BLUE SHIELD | Admitting: Internal Medicine

## 2017-02-24 VITALS — BP 118/76 | HR 73 | Temp 97.9°F | Ht 69.0 in | Wt 169.0 lb

## 2017-02-24 DIAGNOSIS — Z0000 Encounter for general adult medical examination without abnormal findings: Secondary | ICD-10-CM | POA: Diagnosis not present

## 2017-02-24 DIAGNOSIS — F419 Anxiety disorder, unspecified: Secondary | ICD-10-CM | POA: Diagnosis not present

## 2017-02-24 DIAGNOSIS — F4321 Adjustment disorder with depressed mood: Secondary | ICD-10-CM | POA: Diagnosis not present

## 2017-02-24 DIAGNOSIS — Z23 Encounter for immunization: Secondary | ICD-10-CM | POA: Diagnosis not present

## 2017-02-24 LAB — URINALYSIS
Bilirubin Urine: NEGATIVE
Hgb urine dipstick: NEGATIVE
Ketones, ur: NEGATIVE
Leukocytes, UA: NEGATIVE
Nitrite: NEGATIVE
Specific Gravity, Urine: 1.01 (ref 1.000–1.030)
Total Protein, Urine: NEGATIVE
Urine Glucose: NEGATIVE
Urobilinogen, UA: 0.2 (ref 0.0–1.0)
pH: 8 (ref 5.0–8.0)

## 2017-02-24 LAB — HEPATIC FUNCTION PANEL
ALT: 19 U/L (ref 0–35)
AST: 19 U/L (ref 0–37)
Albumin: 4.3 g/dL (ref 3.5–5.2)
Alkaline Phosphatase: 64 U/L (ref 39–117)
Bilirubin, Direct: 0.1 mg/dL (ref 0.0–0.3)
Total Bilirubin: 0.6 mg/dL (ref 0.2–1.2)
Total Protein: 7 g/dL (ref 6.0–8.3)

## 2017-02-24 LAB — BASIC METABOLIC PANEL
BUN: 19 mg/dL (ref 6–23)
CO2: 31 mEq/L (ref 19–32)
Calcium: 9.3 mg/dL (ref 8.4–10.5)
Chloride: 99 mEq/L (ref 96–112)
Creatinine, Ser: 0.67 mg/dL (ref 0.40–1.20)
GFR: 95.9 mL/min (ref >60.00)
Glucose, Bld: 107 mg/dL — ABNORMAL HIGH (ref 70–99)
Potassium: 4.1 mEq/L (ref 3.5–5.1)
Sodium: 135 mEq/L (ref 135–145)

## 2017-02-24 LAB — LIPID PANEL
Cholesterol: 193 mg/dL (ref 0–200)
HDL: 62.6 mg/dL (ref >39.00)
LDL Cholesterol: 119 mg/dL — ABNORMAL HIGH (ref 0–99)
NonHDL: 130.33
Total CHOL/HDL Ratio: 3
Triglycerides: 59 mg/dL (ref 0.0–149.0)
VLDL: 11.8 mg/dL (ref 0.0–40.0)

## 2017-02-24 LAB — CBC WITH DIFFERENTIAL/PLATELET
Basophils Absolute: 0 10*3/uL (ref 0.0–0.1)
Basophils Relative: 0.9 % (ref 0.0–3.0)
Eosinophils Absolute: 0.1 10*3/uL (ref 0.0–0.7)
Eosinophils Relative: 2.4 % (ref 0.0–5.0)
HCT: 41 % (ref 36.0–46.0)
Hemoglobin: 14 g/dL (ref 12.0–15.0)
Lymphocytes Relative: 27.6 % (ref 12.0–46.0)
Lymphs Abs: 1.1 10*3/uL (ref 0.7–4.0)
MCHC: 34.1 g/dL (ref 30.0–36.0)
MCV: 92.4 fl (ref 78.0–100.0)
Monocytes Absolute: 0.4 10*3/uL (ref 0.1–1.0)
Monocytes Relative: 10.3 % (ref 3.0–12.0)
Neutro Abs: 2.4 10*3/uL (ref 1.4–7.7)
Neutrophils Relative %: 58.8 % (ref 43.0–77.0)
Platelets: 186 10*3/uL (ref 150.0–400.0)
RBC: 4.44 Mil/uL (ref 3.87–5.11)
RDW: 12.8 % (ref 11.5–15.5)
WBC: 4.1 10*3/uL (ref 4.0–10.5)

## 2017-02-24 LAB — TSH: TSH: 2.09 u[IU]/mL (ref 0.35–4.50)

## 2017-02-24 NOTE — Assessment & Plan Note (Addendum)
Lexapro Lorazepam prn rare 

## 2017-02-24 NOTE — Assessment & Plan Note (Signed)
We discussed age appropriate health related issues, including available/recomended screening tests and vaccinations. We discussed a need for adhering to healthy diet and exercise. Labs/EKG were reviewed/ordered. All questions were answered. LMP 12/14 Colon  Is due Dr Kinnie ScalesMedoff 2019 GYN/PAP q 12 mo Shingrix

## 2017-02-24 NOTE — Progress Notes (Signed)
Subjective:  Patient ID: Megan Garner, female    DOB: 16-Jun-1960  Age: 57 y.o. MRN: [id number]  CC: No chief complaint on file.   HPI Megan Garner presents for a well exam Megan Garner died 3 wks ago - grieving  Outpatient Medications Prior to Visit  Medication Sig Dispense Refill  . Calcium Carbonate-Vitamin D (CALCIUM 600 + D PO) Take 1 each by mouth daily.      . Cholecalciferol (VITAMIN D3) 1000 UNITS CAPS Take by mouth.      . citalopram (CELEXA) 20 MG tablet Take 1 tablet (20 mg total) by mouth daily. 90 tablet 3  . famotidine (PEPCID) 20 MG tablet Take 20 mg by mouth 2 (two) times daily.      . Flaxseed, Linseed, (FLAX SEED OIL PO) Take by mouth.    Marland Kitchen LORazepam (ATIVAN) 1 MG tablet Take 0.5-1 tablets (0.5-1 mg total) by mouth 2 (two) times daily as needed for anxiety or sleep. 60 tablet 0  . Omega-3 Fatty Acids (FISH OIL) 1000 MG CAPS Take 1 each by mouth daily.      Marland Kitchen OVER THE COUNTER MEDICATION Shaklee Stress Relief Complex     No facility-administered medications prior to visit.     ROS Review of Systems  Constitutional: Negative for activity change, appetite change, chills, fatigue and unexpected weight change.  HENT: Negative for congestion, mouth sores and sinus pressure.   Eyes: Negative for visual disturbance.  Respiratory: Negative for cough and chest tightness.   Gastrointestinal: Negative for abdominal pain and nausea.  Genitourinary: Negative for difficulty urinating, frequency and vaginal pain.  Musculoskeletal: Negative for back pain and gait problem.  Skin: Negative for pallor and rash.  Neurological: Negative for dizziness, tremors, weakness, numbness and headaches.  Psychiatric/Behavioral: Positive for sleep disturbance. Negative for confusion and suicidal ideas. The patient is nervous/anxious.     Objective:  BP 118/76 (BP Location: Left Arm, Patient Position: Sitting, Cuff Size: Normal)   Pulse 73   Temp 97.9 F (36.6 C) (Oral)   Ht 5\' 9"  (1.753 m)   Wt  169 lb (76.7 kg)   LMP 03/24/2011   SpO2 100%   BMI 24.96 kg/m   BP Readings from Last 3 Encounters:  02/24/17 118/76  01/06/17 118/82  04/12/16 122/78    Wt Readings from Last 3 Encounters:  02/24/17 169 lb (76.7 kg)  01/06/17 174 lb (78.9 kg)  04/12/16 172 lb (78 kg)    Physical Exam  Constitutional: She appears well-developed. No distress.  HENT:  Head: Normocephalic.  Right Ear: External ear normal.  Left Ear: External ear normal.  Nose: Nose normal.  Mouth/Throat: Oropharynx is clear and moist.  Eyes: Conjunctivae are normal. Pupils are equal, round, and reactive to light. Right eye exhibits no discharge. Left eye exhibits no discharge.  Neck: Normal range of motion. Neck supple. No JVD present. No tracheal deviation present. No thyromegaly present.  Cardiovascular: Normal rate, regular rhythm and normal heart sounds.  Pulmonary/Chest: No stridor. No respiratory distress. She has no wheezes.  Abdominal: Soft. Bowel sounds are normal. She exhibits no distension and no mass. There is no tenderness. There is no rebound and no guarding.  Musculoskeletal: She exhibits no edema or tenderness.  Lymphadenopathy:    She has no cervical adenopathy.  Neurological: She displays normal reflexes. No cranial nerve deficit. She exhibits normal muscle tone. Coordination normal.  Skin: No rash noted. No erythema.  Psychiatric: Her behavior is normal. Judgment and thought content normal.  Sad  Lab Results  Component Value Date   WBC 4.0 11/18/2014   HGB 13.8 11/18/2014   HCT 40.3 11/18/2014   PLT 191.0 11/18/2014   GLUCOSE 93 11/18/2014   CHOL 203 (H) 11/18/2014   TRIG 94.0 11/18/2014   HDL 65.60 11/18/2014   LDLCALC 119 (H) 11/18/2014   ALT 14 11/18/2014   AST 18 11/18/2014   NA 140 11/18/2014   K 4.0 11/18/2014   CL 102 11/18/2014   CREATININE 0.72 11/18/2014   BUN 14 11/18/2014   CO2 28 11/18/2014   TSH 2.06 11/18/2014    Mm Screening Breast Tomo Bilateral  Result  Date: 08/27/2013 CLINICAL DATA:  Screening. EXAM: DIGITAL SCREENING BILATERAL MAMMOGRAM WITH 3D TOMO WITH CAD DIGITAL BREAST TOMOSYNTHESIS Digital breast tomosynthesis images are acquired in two projections. These images are reviewed in combination with the digital mammogram, confirming the findings below. COMPARISON:  Previous exam(s). ACR Breast Density Category c: The breast tissue is heterogeneously dense, which may obscure small masses. FINDINGS: There are no findings suspicious for malignancy. Images were processed with CAD. IMPRESSION: No mammographic evidence of malignancy. A result letter of this screening mammogram will be mailed directly to the patient. RECOMMENDATION: Screening mammogram in one year. (Code:SM-B-01Y) BI-RADS CATEGORY  1: Negative. Electronically Signed   By: Anselmo Picklerandy  Jackson M.D.   On: 08/27/2013 14:57    Assessment & Plan:   There are no diagnoses linked to this encounter. I am having Megan Garner maintain her Vitamin D3, Fish Oil, Calcium Carbonate-Vitamin D (CALCIUM 600 + D PO), famotidine, (Flaxseed, Linseed, (FLAX SEED OIL PO)), citalopram, LORazepam, and OVER THE COUNTER MEDICATION.  No orders of the defined types were placed in this encounter.    Follow-up: No Follow-up on file.  Sonda PrimesAlex Colandra Ohanian, MD

## 2017-02-24 NOTE — Assessment & Plan Note (Signed)
Discussed Support system is good

## 2017-02-28 DIAGNOSIS — M9903 Segmental and somatic dysfunction of lumbar region: Secondary | ICD-10-CM | POA: Diagnosis not present

## 2017-02-28 DIAGNOSIS — M545 Low back pain: Secondary | ICD-10-CM | POA: Diagnosis not present

## 2017-03-21 DIAGNOSIS — M545 Low back pain: Secondary | ICD-10-CM | POA: Diagnosis not present

## 2017-03-21 DIAGNOSIS — M9903 Segmental and somatic dysfunction of lumbar region: Secondary | ICD-10-CM | POA: Diagnosis not present

## 2017-03-21 DIAGNOSIS — M5137 Other intervertebral disc degeneration, lumbosacral region: Secondary | ICD-10-CM | POA: Diagnosis not present

## 2017-03-23 DIAGNOSIS — N879 Dysplasia of cervix uteri, unspecified: Secondary | ICD-10-CM | POA: Diagnosis not present

## 2017-03-23 DIAGNOSIS — N888 Other specified noninflammatory disorders of cervix uteri: Secondary | ICD-10-CM | POA: Diagnosis not present

## 2017-04-18 DIAGNOSIS — M545 Low back pain: Secondary | ICD-10-CM | POA: Diagnosis not present

## 2017-04-18 DIAGNOSIS — M5137 Other intervertebral disc degeneration, lumbosacral region: Secondary | ICD-10-CM | POA: Diagnosis not present

## 2017-04-18 DIAGNOSIS — M9903 Segmental and somatic dysfunction of lumbar region: Secondary | ICD-10-CM | POA: Diagnosis not present

## 2017-04-24 ENCOUNTER — Other Ambulatory Visit: Payer: Self-pay | Admitting: Internal Medicine

## 2017-04-24 NOTE — Telephone Encounter (Signed)
Check  registry last filled 02/10/2017.Marland Kitchen.Raechel Chute/lmb

## 2017-04-25 MED ORDER — LORAZEPAM 1 MG PO TABS: 0.5000 mg | ORAL_TABLET | Freq: Two times a day (BID) | ORAL | 2 refills | Status: DC | PRN

## 2017-05-12 ENCOUNTER — Ambulatory Visit (INDEPENDENT_AMBULATORY_CARE_PROVIDER_SITE_OTHER): Payer: BLUE CROSS/BLUE SHIELD

## 2017-05-12 DIAGNOSIS — Z299 Encounter for prophylactic measures, unspecified: Secondary | ICD-10-CM

## 2017-05-18 DIAGNOSIS — M545 Low back pain: Secondary | ICD-10-CM | POA: Diagnosis not present

## 2017-05-18 DIAGNOSIS — M9903 Segmental and somatic dysfunction of lumbar region: Secondary | ICD-10-CM | POA: Diagnosis not present

## 2017-05-18 DIAGNOSIS — M5137 Other intervertebral disc degeneration, lumbosacral region: Secondary | ICD-10-CM | POA: Diagnosis not present

## 2017-07-14 ENCOUNTER — Ambulatory Visit (INDEPENDENT_AMBULATORY_CARE_PROVIDER_SITE_OTHER): Payer: BLUE CROSS/BLUE SHIELD

## 2017-07-14 DIAGNOSIS — Z299 Encounter for prophylactic measures, unspecified: Secondary | ICD-10-CM

## 2017-08-03 DIAGNOSIS — M9903 Segmental and somatic dysfunction of lumbar region: Secondary | ICD-10-CM | POA: Diagnosis not present

## 2017-08-03 DIAGNOSIS — M5137 Other intervertebral disc degeneration, lumbosacral region: Secondary | ICD-10-CM | POA: Diagnosis not present

## 2017-08-03 DIAGNOSIS — M545 Low back pain: Secondary | ICD-10-CM | POA: Diagnosis not present

## 2017-08-21 DIAGNOSIS — M5137 Other intervertebral disc degeneration, lumbosacral region: Secondary | ICD-10-CM | POA: Diagnosis not present

## 2017-08-21 DIAGNOSIS — M545 Low back pain: Secondary | ICD-10-CM | POA: Diagnosis not present

## 2017-08-21 DIAGNOSIS — M9903 Segmental and somatic dysfunction of lumbar region: Secondary | ICD-10-CM | POA: Diagnosis not present

## 2017-09-12 DIAGNOSIS — M545 Low back pain: Secondary | ICD-10-CM | POA: Diagnosis not present

## 2017-09-12 DIAGNOSIS — M9903 Segmental and somatic dysfunction of lumbar region: Secondary | ICD-10-CM | POA: Diagnosis not present

## 2017-09-12 DIAGNOSIS — M5137 Other intervertebral disc degeneration, lumbosacral region: Secondary | ICD-10-CM | POA: Diagnosis not present

## 2017-10-12 DIAGNOSIS — M9903 Segmental and somatic dysfunction of lumbar region: Secondary | ICD-10-CM | POA: Diagnosis not present

## 2017-10-12 DIAGNOSIS — M5137 Other intervertebral disc degeneration, lumbosacral region: Secondary | ICD-10-CM | POA: Diagnosis not present

## 2017-10-12 DIAGNOSIS — M545 Low back pain: Secondary | ICD-10-CM | POA: Diagnosis not present

## 2017-11-02 DIAGNOSIS — F4310 Post-traumatic stress disorder, unspecified: Secondary | ICD-10-CM | POA: Diagnosis not present

## 2017-11-13 DIAGNOSIS — F4310 Post-traumatic stress disorder, unspecified: Secondary | ICD-10-CM | POA: Diagnosis not present

## 2017-11-16 DIAGNOSIS — M9903 Segmental and somatic dysfunction of lumbar region: Secondary | ICD-10-CM | POA: Diagnosis not present

## 2017-11-16 DIAGNOSIS — M545 Low back pain: Secondary | ICD-10-CM | POA: Diagnosis not present

## 2017-11-16 DIAGNOSIS — M5137 Other intervertebral disc degeneration, lumbosacral region: Secondary | ICD-10-CM | POA: Diagnosis not present

## 2017-11-24 ENCOUNTER — Other Ambulatory Visit: Payer: Self-pay | Admitting: Internal Medicine

## 2017-12-07 DIAGNOSIS — F4310 Post-traumatic stress disorder, unspecified: Secondary | ICD-10-CM | POA: Diagnosis not present

## 2017-12-18 DIAGNOSIS — F4310 Post-traumatic stress disorder, unspecified: Secondary | ICD-10-CM | POA: Diagnosis not present

## 2017-12-27 DIAGNOSIS — M545 Low back pain: Secondary | ICD-10-CM | POA: Diagnosis not present

## 2017-12-27 DIAGNOSIS — M9903 Segmental and somatic dysfunction of lumbar region: Secondary | ICD-10-CM | POA: Diagnosis not present

## 2017-12-27 DIAGNOSIS — M5137 Other intervertebral disc degeneration, lumbosacral region: Secondary | ICD-10-CM | POA: Diagnosis not present

## 2018-01-11 DIAGNOSIS — F4310 Post-traumatic stress disorder, unspecified: Secondary | ICD-10-CM | POA: Diagnosis not present

## 2018-01-24 DIAGNOSIS — M545 Low back pain: Secondary | ICD-10-CM | POA: Diagnosis not present

## 2018-01-24 DIAGNOSIS — M9903 Segmental and somatic dysfunction of lumbar region: Secondary | ICD-10-CM | POA: Diagnosis not present

## 2018-01-24 DIAGNOSIS — M5137 Other intervertebral disc degeneration, lumbosacral region: Secondary | ICD-10-CM | POA: Diagnosis not present

## 2018-01-25 DIAGNOSIS — F4310 Post-traumatic stress disorder, unspecified: Secondary | ICD-10-CM | POA: Diagnosis not present

## 2018-02-26 DIAGNOSIS — M5137 Other intervertebral disc degeneration, lumbosacral region: Secondary | ICD-10-CM | POA: Diagnosis not present

## 2018-02-26 DIAGNOSIS — M9903 Segmental and somatic dysfunction of lumbar region: Secondary | ICD-10-CM | POA: Diagnosis not present

## 2018-02-26 DIAGNOSIS — M545 Low back pain: Secondary | ICD-10-CM | POA: Diagnosis not present

## 2018-03-07 DIAGNOSIS — Z130 Encounter for screening for diseases of the blood and blood-forming organs and certain disorders involving the immune mechanism: Secondary | ICD-10-CM | POA: Diagnosis not present

## 2018-03-07 DIAGNOSIS — Z1231 Encounter for screening mammogram for malignant neoplasm of breast: Secondary | ICD-10-CM | POA: Diagnosis not present

## 2018-03-07 DIAGNOSIS — Z01419 Encounter for gynecological examination (general) (routine) without abnormal findings: Secondary | ICD-10-CM | POA: Diagnosis not present

## 2018-03-07 DIAGNOSIS — Z780 Asymptomatic menopausal state: Secondary | ICD-10-CM | POA: Diagnosis not present

## 2018-03-07 DIAGNOSIS — B001 Herpesviral vesicular dermatitis: Secondary | ICD-10-CM | POA: Diagnosis not present

## 2018-03-07 DIAGNOSIS — R87810 Cervical high risk human papillomavirus (HPV) DNA test positive: Secondary | ICD-10-CM | POA: Diagnosis not present

## 2018-03-18 DIAGNOSIS — Z85820 Personal history of malignant melanoma of skin: Secondary | ICD-10-CM | POA: Insufficient documentation

## 2018-03-18 DIAGNOSIS — Z800 Family history of malignant neoplasm of digestive organs: Secondary | ICD-10-CM | POA: Insufficient documentation

## 2018-03-19 DIAGNOSIS — Z1211 Encounter for screening for malignant neoplasm of colon: Secondary | ICD-10-CM | POA: Diagnosis not present

## 2018-03-19 DIAGNOSIS — R109 Unspecified abdominal pain: Secondary | ICD-10-CM | POA: Diagnosis not present

## 2018-03-19 DIAGNOSIS — Z800 Family history of malignant neoplasm of digestive organs: Secondary | ICD-10-CM | POA: Diagnosis not present

## 2018-03-19 DIAGNOSIS — K2930 Chronic superficial gastritis without bleeding: Secondary | ICD-10-CM | POA: Diagnosis not present

## 2018-03-19 DIAGNOSIS — K2980 Duodenitis without bleeding: Secondary | ICD-10-CM | POA: Diagnosis not present

## 2018-03-19 DIAGNOSIS — R1013 Epigastric pain: Secondary | ICD-10-CM | POA: Diagnosis not present

## 2018-03-19 DIAGNOSIS — K2950 Unspecified chronic gastritis without bleeding: Secondary | ICD-10-CM | POA: Diagnosis not present

## 2018-03-19 DIAGNOSIS — K449 Diaphragmatic hernia without obstruction or gangrene: Secondary | ICD-10-CM | POA: Diagnosis not present

## 2018-03-19 DIAGNOSIS — K3189 Other diseases of stomach and duodenum: Secondary | ICD-10-CM | POA: Diagnosis not present

## 2018-03-19 LAB — HM COLONOSCOPY

## 2018-03-21 ENCOUNTER — Encounter: Payer: Self-pay | Admitting: Internal Medicine

## 2018-03-23 ENCOUNTER — Other Ambulatory Visit: Payer: Self-pay

## 2018-03-27 DIAGNOSIS — M545 Low back pain: Secondary | ICD-10-CM | POA: Diagnosis not present

## 2018-03-27 DIAGNOSIS — M9903 Segmental and somatic dysfunction of lumbar region: Secondary | ICD-10-CM | POA: Diagnosis not present

## 2018-03-27 DIAGNOSIS — M5137 Other intervertebral disc degeneration, lumbosacral region: Secondary | ICD-10-CM | POA: Diagnosis not present

## 2018-04-17 ENCOUNTER — Other Ambulatory Visit: Payer: Self-pay

## 2018-04-23 DIAGNOSIS — J4520 Mild intermittent asthma, uncomplicated: Secondary | ICD-10-CM | POA: Diagnosis not present

## 2018-04-23 DIAGNOSIS — K3189 Other diseases of stomach and duodenum: Secondary | ICD-10-CM | POA: Diagnosis not present

## 2018-05-07 NOTE — Telephone Encounter (Signed)
LM for patient to call to schedule virtual and reschedule CPE

## 2018-05-08 ENCOUNTER — Telehealth: Payer: Self-pay | Admitting: Internal Medicine

## 2018-05-08 NOTE — Telephone Encounter (Unsigned)
Copied from CRM [id number]. Topic: Quick Communication - Rx Refill/Question >> May 08, 2018  9:16 AM Elliot Gault wrote: Medication: citalopram (CELEXA) 20 MG tablet [facility] - [place], [place] - [address] [phone] (Phone)   LORazepam (ATIVAN) 1 MG tablet  [id number] [place], [place] - [address] [phone] (Phone)   Agent: Please be advised that RX refills may take up to 3 business days. We ask that you follow-up with your pharmacy.  *patient would like to schedule web ex for a follow up appointment. Patient confirmed she has a computer, please advise

## 2018-05-09 NOTE — Telephone Encounter (Signed)
Pt scheduled for virtual visit tomorrow.  °

## 2018-05-10 ENCOUNTER — Ambulatory Visit (INDEPENDENT_AMBULATORY_CARE_PROVIDER_SITE_OTHER): Payer: BLUE CROSS/BLUE SHIELD | Admitting: Internal Medicine

## 2018-05-10 ENCOUNTER — Encounter: Payer: Self-pay | Admitting: Internal Medicine

## 2018-05-10 DIAGNOSIS — J301 Allergic rhinitis due to pollen: Secondary | ICD-10-CM

## 2018-05-10 DIAGNOSIS — J4520 Mild intermittent asthma, uncomplicated: Secondary | ICD-10-CM

## 2018-05-10 DIAGNOSIS — F419 Anxiety disorder, unspecified: Secondary | ICD-10-CM

## 2018-05-10 MED ORDER — LORAZEPAM 1 MG PO TABS: 0.5000 mg | ORAL_TABLET | Freq: Two times a day (BID) | ORAL | 2 refills | Status: DC | PRN

## 2018-05-10 MED ORDER — CITALOPRAM HYDROBROMIDE 20 MG PO TABS: 20.0000 mg | ORAL_TABLET | Freq: Every day | ORAL | 3 refills | Status: DC

## 2018-05-10 NOTE — Progress Notes (Signed)
Virtual Visit via Telephone Note  I connected with Megan Garner on 05/10/18 at  3:00 PM EDT by telephone and verified that I am speaking with the correct person using two identifiers.   I discussed the limitations, risks, security and privacy concerns of performing an evaluation and management service by telephone and the availability of in person appointments. I also discussed with the patient that there may be a patient responsible charge related to this service. The patient expressed understanding and agreed to proceed.   History of Present Illness: F/u anxiety, allergies, elevated BP   Observations/Objective: NAD Looks well BP 135/82 per pt Assessment and Plan: See plan COVID19 prevention discussed  Follow Up Instructions:    I discussed the assessment and treatment plan with the patient. The patient was provided an opportunity to ask questions and all were answered. The patient agreed with the plan and demonstrated an understanding of the instructions.   The patient was advised to call back or seek an in-person evaluation if the symptoms worsen or if the condition fails to improve as anticipated.  I provided 20 minutes of non-face-to-face time during this encounter.   Sonda Primes, MD

## 2018-05-10 NOTE — Assessment & Plan Note (Addendum)
No relapse COVID19 prevention discussed

## 2018-05-10 NOTE — Assessment & Plan Note (Signed)
Claritin prn 

## 2018-05-10 NOTE — Assessment & Plan Note (Signed)
Lexapro Xanax prn  Potential benefits of a long term benzodiazepines  use as well as potential risks  and complications were explained to the patient and were aknowledged. 

## 2018-05-28 ENCOUNTER — Encounter: Payer: BLUE CROSS/BLUE SHIELD | Admitting: Internal Medicine

## 2019-01-04 DIAGNOSIS — Z20828 Contact with and (suspected) exposure to other viral communicable diseases: Secondary | ICD-10-CM | POA: Diagnosis not present

## 2019-01-22 DIAGNOSIS — M9903 Segmental and somatic dysfunction of lumbar region: Secondary | ICD-10-CM | POA: Diagnosis not present

## 2019-01-22 DIAGNOSIS — M545 Low back pain: Secondary | ICD-10-CM | POA: Diagnosis not present

## 2019-01-22 DIAGNOSIS — M5137 Other intervertebral disc degeneration, lumbosacral region: Secondary | ICD-10-CM | POA: Diagnosis not present

## 2019-03-05 DIAGNOSIS — M545 Low back pain: Secondary | ICD-10-CM | POA: Diagnosis not present

## 2019-03-05 DIAGNOSIS — M5137 Other intervertebral disc degeneration, lumbosacral region: Secondary | ICD-10-CM | POA: Diagnosis not present

## 2019-03-05 DIAGNOSIS — M9903 Segmental and somatic dysfunction of lumbar region: Secondary | ICD-10-CM | POA: Diagnosis not present

## 2019-03-12 DIAGNOSIS — Z6826 Body mass index (BMI) 26.0-26.9, adult: Secondary | ICD-10-CM | POA: Diagnosis not present

## 2019-03-12 DIAGNOSIS — Z780 Asymptomatic menopausal state: Secondary | ICD-10-CM | POA: Diagnosis not present

## 2019-03-12 DIAGNOSIS — R87810 Cervical high risk human papillomavirus (HPV) DNA test positive: Secondary | ICD-10-CM | POA: Diagnosis not present

## 2019-03-12 DIAGNOSIS — Z130 Encounter for screening for diseases of the blood and blood-forming organs and certain disorders involving the immune mechanism: Secondary | ICD-10-CM | POA: Diagnosis not present

## 2019-03-12 DIAGNOSIS — Z1231 Encounter for screening mammogram for malignant neoplasm of breast: Secondary | ICD-10-CM | POA: Diagnosis not present

## 2019-03-12 DIAGNOSIS — Z01419 Encounter for gynecological examination (general) (routine) without abnormal findings: Secondary | ICD-10-CM | POA: Diagnosis not present

## 2019-03-13 DIAGNOSIS — Z124 Encounter for screening for malignant neoplasm of cervix: Secondary | ICD-10-CM | POA: Diagnosis not present

## 2019-03-13 DIAGNOSIS — Z1151 Encounter for screening for human papillomavirus (HPV): Secondary | ICD-10-CM | POA: Diagnosis not present

## 2019-04-16 DIAGNOSIS — M9903 Segmental and somatic dysfunction of lumbar region: Secondary | ICD-10-CM | POA: Diagnosis not present

## 2019-04-16 DIAGNOSIS — M545 Low back pain: Secondary | ICD-10-CM | POA: Diagnosis not present

## 2019-04-16 DIAGNOSIS — M5137 Other intervertebral disc degeneration, lumbosacral region: Secondary | ICD-10-CM | POA: Diagnosis not present

## 2019-04-20 ENCOUNTER — Other Ambulatory Visit: Payer: Self-pay | Admitting: Internal Medicine

## 2019-04-25 DIAGNOSIS — R87619 Unspecified abnormal cytological findings in specimens from cervix uteri: Secondary | ICD-10-CM | POA: Diagnosis not present

## 2019-04-25 DIAGNOSIS — R87810 Cervical high risk human papillomavirus (HPV) DNA test positive: Secondary | ICD-10-CM | POA: Diagnosis not present

## 2019-05-28 DIAGNOSIS — M5137 Other intervertebral disc degeneration, lumbosacral region: Secondary | ICD-10-CM | POA: Diagnosis not present

## 2019-05-28 DIAGNOSIS — M545 Low back pain: Secondary | ICD-10-CM | POA: Diagnosis not present

## 2019-05-28 DIAGNOSIS — M9903 Segmental and somatic dysfunction of lumbar region: Secondary | ICD-10-CM | POA: Diagnosis not present

## 2019-06-11 ENCOUNTER — Ambulatory Visit (INDEPENDENT_AMBULATORY_CARE_PROVIDER_SITE_OTHER): Payer: BC Managed Care – PPO | Admitting: Internal Medicine

## 2019-06-11 ENCOUNTER — Encounter: Payer: Self-pay | Admitting: Internal Medicine

## 2019-06-11 ENCOUNTER — Other Ambulatory Visit: Payer: Self-pay

## 2019-06-11 VITALS — BP 116/78 | HR 76 | Temp 98.1°F | Ht 69.0 in | Wt 166.0 lb

## 2019-06-11 DIAGNOSIS — F419 Anxiety disorder, unspecified: Secondary | ICD-10-CM

## 2019-06-11 DIAGNOSIS — E785 Hyperlipidemia, unspecified: Secondary | ICD-10-CM

## 2019-06-11 DIAGNOSIS — Z0000 Encounter for general adult medical examination without abnormal findings: Secondary | ICD-10-CM | POA: Diagnosis not present

## 2019-06-11 MED ORDER — CITALOPRAM HYDROBROMIDE 20 MG PO TABS: ORAL_TABLET | ORAL | 1 refills | Status: DC

## 2019-06-11 MED ORDER — VITAMIN D3 50 MCG (2000 UT) PO CAPS: 2000.0000 [IU] | ORAL_CAPSULE | Freq: Every day | ORAL | 3 refills | Status: AC

## 2019-06-11 NOTE — Patient Instructions (Signed)

## 2019-06-11 NOTE — Assessment & Plan Note (Addendum)
  We discussed age appropriate health related issues, including available/recomended screening tests and vaccinations. We discussed a need for adhering to healthy diet and exercise. Labs/EKG were reviewed/ordered. All questions were answered. LMP 12/14 Colon Dr Kinnie Scales 2020 GYN/PAP q 12 mo Shingrix Cor calcium CT ordered 2021

## 2019-06-11 NOTE — Assessment & Plan Note (Signed)
Lexapro Xanax prn  Potential benefits of a long term benzodiazepines  use as well as potential risks  and complications were explained to the patient and were aknowledged. 

## 2019-06-11 NOTE — Progress Notes (Signed)
Subjective:  Patient ID: Megan Garner, female    DOB: 1960-06-21  Age: 59 y.o. MRN: [id number]  CC: No chief complaint on file.   HPI [name] presents for a well exam  Outpatient Medications Prior to Visit  Medication Sig Dispense Refill  . Calcium Carbonate-Vitamin D (CALCIUM 600 + D PO) Take 1 each by mouth daily.      . Cholecalciferol (VITAMIN D3) 1000 UNITS CAPS Take by mouth.      . citalopram (CELEXA) 20 MG tablet TAKE 1 TABLET BY MOUTH  DAILY. PATIENT NEEDS OFFICE VISIT BEFORE REFILLS WILL  BE GIVEN 90 tablet 1  . LORazepam (ATIVAN) 1 MG tablet Take 0.5-1 tablets (0.5-1 mg total) by mouth 2 (two) times daily as needed for anxiety or sleep. 60 tablet 2  . Omega-3 Fatty Acids (FISH OIL) 1000 MG CAPS Take 1 each by mouth daily.      Marland Kitchen OVER THE COUNTER MEDICATION Shaklee Stress Relief Complex    . famotidine (PEPCID) 20 MG tablet Take 20 mg by mouth 2 (two) times daily.       No facility-administered medications prior to visit.    ROS: Review of Systems  Constitutional: Negative for activity change, appetite change, chills, fatigue and unexpected weight change.  HENT: Negative for congestion, mouth sores and sinus pressure.   Eyes: Negative for visual disturbance.  Respiratory: Negative for cough and chest tightness.   Gastrointestinal: Negative for abdominal pain and nausea.  Genitourinary: Negative for difficulty urinating, frequency and vaginal pain.  Musculoskeletal: Negative for back pain and gait problem.  Skin: Negative for pallor and rash.  Neurological: Negative for dizziness, tremors, weakness, numbness and headaches.  Psychiatric/Behavioral: Negative for confusion, sleep disturbance and suicidal ideas.    Objective:  LMP 03/24/2011   BP Readings from Last 3 Encounters:  02/24/17 118/76  01/06/17 118/82  04/12/16 122/78    Wt Readings from Last 3 Encounters:  02/24/17 169 lb (76.7 kg)  01/06/17 174 lb (78.9 kg)  04/12/16 172 lb (78 kg)    Physical  Exam Constitutional:      General: She is not in acute distress.    Appearance: She is well-developed.  HENT:     Head: Normocephalic.     Right Ear: External ear normal.     Left Ear: External ear normal.     Nose: Nose normal.  Eyes:     General:        Right eye: No discharge.        Left eye: No discharge.     Conjunctiva/sclera: Conjunctivae normal.     Pupils: Pupils are equal, round, and reactive to light.  Neck:     Thyroid: No thyromegaly.     Vascular: No JVD.     Trachea: No tracheal deviation.  Cardiovascular:     Rate and Rhythm: Normal rate and regular rhythm.     Heart sounds: Normal heart sounds.  Pulmonary:     Effort: No respiratory distress.     Breath sounds: No stridor. No wheezing.  Abdominal:     General: Bowel sounds are normal. There is no distension.     Palpations: Abdomen is soft. There is no mass.     Tenderness: There is no abdominal tenderness. There is no guarding or rebound.  Musculoskeletal:        General: No tenderness.     Cervical back: Normal range of motion and neck supple.  Lymphadenopathy:     Cervical: No  cervical adenopathy.  Skin:    Findings: No erythema or rash.  Neurological:     Cranial Nerves: No cranial nerve deficit.     Motor: No abnormal muscle tone.     Coordination: Coordination normal.     Deep Tendon Reflexes: Reflexes normal.  Psychiatric:        Behavior: Behavior normal.        Thought Content: Thought content normal.        Judgment: Judgment normal.     Lab Results  Component Value Date   WBC 4.1 02/24/2017   HGB 14.0 02/24/2017   HCT 41.0 02/24/2017   PLT 186.0 02/24/2017   GLUCOSE 107 (H) 02/24/2017   CHOL 193 02/24/2017   TRIG 59.0 02/24/2017   HDL 62.60 02/24/2017   LDLCALC 119 (H) 02/24/2017   ALT 19 02/24/2017   AST 19 02/24/2017   NA 135 02/24/2017   K 4.1 02/24/2017   CL 99 02/24/2017   CREATININE 0.67 02/24/2017   BUN 19 02/24/2017   CO2 31 02/24/2017   TSH 2.09 02/24/2017     MM SCREENING BREAST TOMO BILATERAL  Result Date: 08/27/2013 CLINICAL DATA:  Screening. EXAM: DIGITAL SCREENING BILATERAL MAMMOGRAM WITH 3D TOMO WITH CAD DIGITAL BREAST TOMOSYNTHESIS Digital breast tomosynthesis images are acquired in two projections. These images are reviewed in combination with the digital mammogram, confirming the findings below. COMPARISON:  Previous exam(s). ACR Breast Density Category c: The breast tissue is heterogeneously dense, which may obscure small masses. FINDINGS: There are no findings suspicious for malignancy. Images were processed with CAD. IMPRESSION: No mammographic evidence of malignancy. A result letter of this screening mammogram will be mailed directly to the patient. RECOMMENDATION: Screening mammogram in one year. (Code:SM-B-01Y) BI-RADS CATEGORY  1: Negative. Electronically Signed   By: Anselmo Pickler M.D.   On: 08/27/2013 14:57    Assessment & Plan:    Sonda Primes, MD

## 2019-06-12 ENCOUNTER — Other Ambulatory Visit (INDEPENDENT_AMBULATORY_CARE_PROVIDER_SITE_OTHER): Payer: BC Managed Care – PPO

## 2019-06-12 DIAGNOSIS — Z20828 Contact with and (suspected) exposure to other viral communicable diseases: Secondary | ICD-10-CM | POA: Diagnosis not present

## 2019-06-12 DIAGNOSIS — Z0000 Encounter for general adult medical examination without abnormal findings: Secondary | ICD-10-CM

## 2019-06-12 DIAGNOSIS — E785 Hyperlipidemia, unspecified: Secondary | ICD-10-CM

## 2019-06-12 DIAGNOSIS — Z20822 Contact with and (suspected) exposure to covid-19: Secondary | ICD-10-CM | POA: Diagnosis not present

## 2019-06-12 LAB — HEPATIC FUNCTION PANEL
ALT: 19 U/L (ref 0–35)
AST: 19 U/L (ref 0–37)
Albumin: 4.3 g/dL (ref 3.5–5.2)
Alkaline Phosphatase: 77 U/L (ref 39–117)
Bilirubin, Direct: 0.1 mg/dL (ref 0.0–0.3)
Total Bilirubin: 0.6 mg/dL (ref 0.2–1.2)
Total Protein: 6.9 g/dL (ref 6.0–8.3)

## 2019-06-12 LAB — URINALYSIS
Bilirubin Urine: NEGATIVE
Hgb urine dipstick: NEGATIVE
Ketones, ur: NEGATIVE
Leukocytes,Ua: NEGATIVE
Nitrite: NEGATIVE
Specific Gravity, Urine: 1.015 (ref 1.000–1.030)
Total Protein, Urine: NEGATIVE
Urine Glucose: NEGATIVE
Urobilinogen, UA: 0.2 (ref 0.0–1.0)
pH: 8.5 — AB (ref 5.0–8.0)

## 2019-06-12 LAB — LIPID PANEL
Cholesterol: 213 mg/dL — ABNORMAL HIGH (ref 0–200)
HDL: 66.6 mg/dL (ref >39.00)
LDL Cholesterol: 131 mg/dL — ABNORMAL HIGH (ref 0–99)
NonHDL: 146.64
Total CHOL/HDL Ratio: 3
Triglycerides: 78 mg/dL (ref 0.0–149.0)
VLDL: 15.6 mg/dL (ref 0.0–40.0)

## 2019-06-12 LAB — BASIC METABOLIC PANEL
BUN: 17 mg/dL (ref 6–23)
CO2: 30 mEq/L (ref 19–32)
Calcium: 9.6 mg/dL (ref 8.4–10.5)
Chloride: 101 mEq/L (ref 96–112)
Creatinine, Ser: 0.78 mg/dL (ref 0.40–1.20)
GFR: 75.12 mL/min (ref >60.00)
Glucose, Bld: 109 mg/dL — ABNORMAL HIGH (ref 70–99)
Potassium: 4.3 mEq/L (ref 3.5–5.1)
Sodium: 138 mEq/L (ref 135–145)

## 2019-06-12 LAB — CBC WITH DIFFERENTIAL/PLATELET
Basophils Absolute: 0 10*3/uL (ref 0.0–0.1)
Basophils Relative: 0.7 % (ref 0.0–3.0)
Eosinophils Absolute: 0.2 10*3/uL (ref 0.0–0.7)
Eosinophils Relative: 4.4 % (ref 0.0–5.0)
HCT: 39.6 % (ref 36.0–46.0)
Hemoglobin: 13.5 g/dL (ref 12.0–15.0)
Lymphocytes Relative: 40.5 % (ref 12.0–46.0)
Lymphs Abs: 1.9 10*3/uL (ref 0.7–4.0)
MCHC: 34 g/dL (ref 30.0–36.0)
MCV: 91.6 fl (ref 78.0–100.0)
Monocytes Absolute: 0.4 10*3/uL (ref 0.1–1.0)
Monocytes Relative: 9.5 % (ref 3.0–12.0)
Neutro Abs: 2.1 10*3/uL (ref 1.4–7.7)
Neutrophils Relative %: 44.9 % (ref 43.0–77.0)
Platelets: 187 10*3/uL (ref 150.0–400.0)
RBC: 4.32 Mil/uL (ref 3.87–5.11)
RDW: 13.2 % (ref 11.5–15.5)
WBC: 4.6 10*3/uL (ref 4.0–10.5)

## 2019-06-17 DIAGNOSIS — K3189 Other diseases of stomach and duodenum: Secondary | ICD-10-CM | POA: Diagnosis not present

## 2019-06-17 DIAGNOSIS — K319 Disease of stomach and duodenum, unspecified: Secondary | ICD-10-CM | POA: Diagnosis not present

## 2019-07-02 ENCOUNTER — Encounter: Payer: Self-pay | Admitting: Internal Medicine

## 2019-07-09 DIAGNOSIS — M5137 Other intervertebral disc degeneration, lumbosacral region: Secondary | ICD-10-CM | POA: Diagnosis not present

## 2019-07-09 DIAGNOSIS — M9903 Segmental and somatic dysfunction of lumbar region: Secondary | ICD-10-CM | POA: Diagnosis not present

## 2019-07-09 DIAGNOSIS — M545 Low back pain: Secondary | ICD-10-CM | POA: Diagnosis not present

## 2019-08-05 DIAGNOSIS — H52202 Unspecified astigmatism, left eye: Secondary | ICD-10-CM | POA: Diagnosis not present

## 2019-08-05 DIAGNOSIS — H524 Presbyopia: Secondary | ICD-10-CM | POA: Diagnosis not present

## 2019-08-05 DIAGNOSIS — H5213 Myopia, bilateral: Secondary | ICD-10-CM | POA: Diagnosis not present

## 2019-08-05 DIAGNOSIS — H02831 Dermatochalasis of right upper eyelid: Secondary | ICD-10-CM | POA: Diagnosis not present

## 2019-08-06 DIAGNOSIS — M545 Low back pain: Secondary | ICD-10-CM | POA: Diagnosis not present

## 2019-08-06 DIAGNOSIS — M5137 Other intervertebral disc degeneration, lumbosacral region: Secondary | ICD-10-CM | POA: Diagnosis not present

## 2019-08-06 DIAGNOSIS — M9903 Segmental and somatic dysfunction of lumbar region: Secondary | ICD-10-CM | POA: Diagnosis not present

## 2019-09-19 DIAGNOSIS — M545 Low back pain: Secondary | ICD-10-CM | POA: Diagnosis not present

## 2019-09-19 DIAGNOSIS — M9903 Segmental and somatic dysfunction of lumbar region: Secondary | ICD-10-CM | POA: Diagnosis not present

## 2019-09-19 DIAGNOSIS — M5137 Other intervertebral disc degeneration, lumbosacral region: Secondary | ICD-10-CM | POA: Diagnosis not present

## 2019-10-25 ENCOUNTER — Other Ambulatory Visit: Payer: Self-pay | Admitting: Internal Medicine

## 2019-11-04 DIAGNOSIS — M545 Low back pain: Secondary | ICD-10-CM | POA: Diagnosis not present

## 2019-11-04 DIAGNOSIS — M9903 Segmental and somatic dysfunction of lumbar region: Secondary | ICD-10-CM | POA: Diagnosis not present

## 2019-11-04 DIAGNOSIS — M5137 Other intervertebral disc degeneration, lumbosacral region: Secondary | ICD-10-CM | POA: Diagnosis not present

## 2019-11-25 DIAGNOSIS — H02834 Dermatochalasis of left upper eyelid: Secondary | ICD-10-CM | POA: Diagnosis not present

## 2019-11-25 DIAGNOSIS — H02831 Dermatochalasis of right upper eyelid: Secondary | ICD-10-CM | POA: Diagnosis not present

## 2019-11-25 DIAGNOSIS — H02423 Myogenic ptosis of bilateral eyelids: Secondary | ICD-10-CM | POA: Diagnosis not present

## 2019-11-25 DIAGNOSIS — H02413 Mechanical ptosis of bilateral eyelids: Secondary | ICD-10-CM | POA: Diagnosis not present

## 2019-12-30 DIAGNOSIS — M9903 Segmental and somatic dysfunction of lumbar region: Secondary | ICD-10-CM | POA: Diagnosis not present

## 2019-12-30 DIAGNOSIS — M5137 Other intervertebral disc degeneration, lumbosacral region: Secondary | ICD-10-CM | POA: Diagnosis not present

## 2019-12-30 DIAGNOSIS — M545 Low back pain, unspecified: Secondary | ICD-10-CM | POA: Diagnosis not present

## 2020-01-23 DIAGNOSIS — M5137 Other intervertebral disc degeneration, lumbosacral region: Secondary | ICD-10-CM | POA: Diagnosis not present

## 2020-01-23 DIAGNOSIS — M545 Low back pain, unspecified: Secondary | ICD-10-CM | POA: Diagnosis not present

## 2020-01-23 DIAGNOSIS — M9903 Segmental and somatic dysfunction of lumbar region: Secondary | ICD-10-CM | POA: Diagnosis not present

## 2020-02-06 DIAGNOSIS — M5137 Other intervertebral disc degeneration, lumbosacral region: Secondary | ICD-10-CM | POA: Diagnosis not present

## 2020-02-06 DIAGNOSIS — M9903 Segmental and somatic dysfunction of lumbar region: Secondary | ICD-10-CM | POA: Diagnosis not present

## 2020-02-06 DIAGNOSIS — M545 Low back pain, unspecified: Secondary | ICD-10-CM | POA: Diagnosis not present

## 2020-03-23 DIAGNOSIS — M5137 Other intervertebral disc degeneration, lumbosacral region: Secondary | ICD-10-CM | POA: Diagnosis not present

## 2020-03-23 DIAGNOSIS — M9903 Segmental and somatic dysfunction of lumbar region: Secondary | ICD-10-CM | POA: Diagnosis not present

## 2020-03-23 DIAGNOSIS — M545 Low back pain, unspecified: Secondary | ICD-10-CM | POA: Diagnosis not present

## 2020-04-28 DIAGNOSIS — Z780 Asymptomatic menopausal state: Secondary | ICD-10-CM | POA: Diagnosis not present

## 2020-04-28 DIAGNOSIS — Z130 Encounter for screening for diseases of the blood and blood-forming organs and certain disorders involving the immune mechanism: Secondary | ICD-10-CM | POA: Diagnosis not present

## 2020-04-28 DIAGNOSIS — Z01419 Encounter for gynecological examination (general) (routine) without abnormal findings: Secondary | ICD-10-CM | POA: Diagnosis not present

## 2020-04-28 DIAGNOSIS — Z6825 Body mass index (BMI) 25.0-25.9, adult: Secondary | ICD-10-CM | POA: Diagnosis not present

## 2020-04-28 DIAGNOSIS — Z1231 Encounter for screening mammogram for malignant neoplasm of breast: Secondary | ICD-10-CM | POA: Diagnosis not present

## 2020-04-28 DIAGNOSIS — R87810 Cervical high risk human papillomavirus (HPV) DNA test positive: Secondary | ICD-10-CM | POA: Diagnosis not present

## 2020-04-29 DIAGNOSIS — M5137 Other intervertebral disc degeneration, lumbosacral region: Secondary | ICD-10-CM | POA: Diagnosis not present

## 2020-04-29 DIAGNOSIS — R87810 Cervical high risk human papillomavirus (HPV) DNA test positive: Secondary | ICD-10-CM | POA: Diagnosis not present

## 2020-04-29 DIAGNOSIS — M9903 Segmental and somatic dysfunction of lumbar region: Secondary | ICD-10-CM | POA: Diagnosis not present

## 2020-04-29 DIAGNOSIS — M545 Low back pain, unspecified: Secondary | ICD-10-CM | POA: Diagnosis not present

## 2020-05-06 DIAGNOSIS — F4310 Post-traumatic stress disorder, unspecified: Secondary | ICD-10-CM | POA: Diagnosis not present

## 2020-06-11 DIAGNOSIS — M545 Low back pain, unspecified: Secondary | ICD-10-CM | POA: Diagnosis not present

## 2020-06-11 DIAGNOSIS — M5137 Other intervertebral disc degeneration, lumbosacral region: Secondary | ICD-10-CM | POA: Diagnosis not present

## 2020-06-11 DIAGNOSIS — M9903 Segmental and somatic dysfunction of lumbar region: Secondary | ICD-10-CM | POA: Diagnosis not present

## 2020-06-13 DIAGNOSIS — Z20822 Contact with and (suspected) exposure to covid-19: Secondary | ICD-10-CM | POA: Diagnosis not present

## 2020-07-14 DIAGNOSIS — M5137 Other intervertebral disc degeneration, lumbosacral region: Secondary | ICD-10-CM | POA: Diagnosis not present

## 2020-07-14 DIAGNOSIS — M9903 Segmental and somatic dysfunction of lumbar region: Secondary | ICD-10-CM | POA: Diagnosis not present

## 2020-07-14 DIAGNOSIS — M545 Low back pain, unspecified: Secondary | ICD-10-CM | POA: Diagnosis not present

## 2020-07-21 ENCOUNTER — Other Ambulatory Visit: Payer: Self-pay

## 2020-07-22 ENCOUNTER — Encounter: Payer: Self-pay | Admitting: Internal Medicine

## 2020-07-22 ENCOUNTER — Ambulatory Visit (INDEPENDENT_AMBULATORY_CARE_PROVIDER_SITE_OTHER): Payer: BC Managed Care – PPO | Admitting: Internal Medicine

## 2020-07-22 VITALS — BP 112/80 | HR 72 | Temp 98.2°F | Ht 69.0 in | Wt 163.6 lb

## 2020-07-22 DIAGNOSIS — Z0000 Encounter for general adult medical examination without abnormal findings: Secondary | ICD-10-CM | POA: Diagnosis not present

## 2020-07-22 DIAGNOSIS — E785 Hyperlipidemia, unspecified: Secondary | ICD-10-CM | POA: Diagnosis not present

## 2020-07-22 MED ORDER — LORAZEPAM 1 MG PO TABS: 0.5000 mg | ORAL_TABLET | Freq: Two times a day (BID) | ORAL | 2 refills | Status: DC | PRN

## 2020-07-22 NOTE — Patient Instructions (Signed)

## 2020-07-22 NOTE — Progress Notes (Signed)
Subjective:  Patient ID: Megan Garner, female    DOB: 11-27-1960  Age: 60 y.o. MRN: [id number]  CC: Annual Exam   HPI Megan Garner presents for a well exam  Outpatient Medications Prior to Visit  Medication Sig Dispense Refill   Cholecalciferol (VITAMIN D3) 50 MCG (2000 UT) capsule Take 1 capsule (2,000 Units total) by mouth daily. 100 capsule 3   citalopram (CELEXA) 20 MG tablet TAKE 1 TABLET BY MOUTH  DAILY 90 tablet 1   LORazepam (ATIVAN) 1 MG tablet Take 0.5-1 tablets (0.5-1 mg total) by mouth 2 (two) times daily as needed for anxiety or sleep. 60 tablet 2   Omega-3 Fatty Acids (FISH OIL) 1000 MG CAPS Take 1 each by mouth daily.       omeprazole (PRILOSEC) 40 MG capsule Take 1 capsule by mouth every morning before breakfast     valACYclovir (VALTREX) 1000 MG tablet Take 1 by mouth twice a day as needed     OVER THE COUNTER MEDICATION [place] Stress Relief Complex (Patient not taking: Reported on 07/22/2020)     No facility-administered medications prior to visit.    ROS: Review of Systems  Constitutional:  Negative for activity change, appetite change, chills, fatigue and unexpected weight change.  HENT:  Negative for congestion, mouth sores and sinus pressure.   Eyes:  Negative for visual disturbance.  Respiratory:  Negative for cough and chest tightness.   Gastrointestinal:  Negative for abdominal pain and nausea.  Genitourinary:  Negative for difficulty urinating, frequency and vaginal pain.  Musculoskeletal:  Negative for back pain and gait problem.  Skin:  Negative for pallor and rash.  Neurological:  Negative for dizziness, tremors, weakness, numbness and headaches.  Psychiatric/Behavioral:  Negative for confusion, sleep disturbance and suicidal ideas.    Objective:  BP 112/80 (BP Location: Left Arm)   Pulse 72   Temp 98.2 F (36.8 C) (Oral)   Ht 5\' 9"  (1.753 m)   Wt 163 lb 9.6 oz (74.2 kg)   LMP 03/24/2011   SpO2 97%   BMI 24.16 kg/m   BP Readings from Last 3  Encounters:  07/22/20 112/80  06/11/19 116/78  02/24/17 118/76    Wt Readings from Last 3 Encounters:  07/22/20 163 lb 9.6 oz (74.2 kg)  06/11/19 166 lb (75.3 kg)  02/24/17 169 lb (76.7 kg)    Physical Exam Constitutional:      General: She is not in acute distress.    Appearance: She is well-developed.  HENT:     Head: Normocephalic.     Right Ear: External ear normal.     Left Ear: External ear normal.     Nose: Nose normal.  Eyes:     General:        Right eye: No discharge.        Left eye: No discharge.     Conjunctiva/sclera: Conjunctivae normal.     Pupils: Pupils are equal, round, and reactive to light.  Neck:     Thyroid: No thyromegaly.     Vascular: No JVD.     Trachea: No tracheal deviation.  Cardiovascular:     Rate and Rhythm: Normal rate and regular rhythm.     Heart sounds: Normal heart sounds.  Pulmonary:     Effort: No respiratory distress.     Breath sounds: No stridor. No wheezing.  Abdominal:     General: Bowel sounds are normal. There is no distension.     Palpations: Abdomen is soft.  There is no mass.     Tenderness: There is no abdominal tenderness. There is no guarding or rebound.  Musculoskeletal:        General: No tenderness.     Cervical back: Normal range of motion and neck supple. No rigidity.  Lymphadenopathy:     Cervical: No cervical adenopathy.  Skin:    Findings: No erythema or rash.  Neurological:     Cranial Nerves: No cranial nerve deficit.     Motor: No abnormal muscle tone.     Coordination: Coordination normal.     Deep Tendon Reflexes: Reflexes normal.  Psychiatric:        Behavior: Behavior normal.        Thought Content: Thought content normal.        Judgment: Judgment normal.    Lab Results  Component Value Date   WBC 4.6 06/12/2019   HGB 13.5 06/12/2019   HCT 39.6 06/12/2019   PLT 187.0 06/12/2019   GLUCOSE 109 (H) 06/12/2019   CHOL 213 (H) 06/12/2019   TRIG 78.0 06/12/2019   HDL 66.60 06/12/2019    LDLCALC 131 (H) 06/12/2019   ALT 19 06/12/2019   AST 19 06/12/2019   NA 138 06/12/2019   K 4.3 06/12/2019   CL 101 06/12/2019   CREATININE 0.78 06/12/2019   BUN 17 06/12/2019   CO2 30 06/12/2019   TSH 2.09 02/24/2017    MM SCREENING BREAST TOMO BILATERAL  Result Date: 08/27/2013 CLINICAL DATA:  Screening. EXAM: DIGITAL SCREENING BILATERAL MAMMOGRAM WITH 3D TOMO WITH CAD DIGITAL BREAST TOMOSYNTHESIS Digital breast tomosynthesis images are acquired in two projections. These images are reviewed in combination with the digital mammogram, confirming the findings below. COMPARISON:  Previous exam(s). ACR Breast Density Category c: The breast tissue is heterogeneously dense, which may obscure small masses. FINDINGS: There are no findings suspicious for malignancy. Images were processed with CAD. IMPRESSION: No mammographic evidence of malignancy. A result letter of this screening mammogram will be mailed directly to the patient. RECOMMENDATION: Screening mammogram in one year. (Code:[id number]-01Y) BI-RADS CATEGORY  1: Negative. Electronically Signed   By: Anselmo Pickler M.D.   On: 08/27/2013 14:57    Assessment & Plan:     Sonda Primes, MD

## 2020-07-22 NOTE — Assessment & Plan Note (Addendum)
  We discussed age appropriate health related issues, including available/recomended screening tests and vaccinations. We discussed a need for adhering to healthy diet and exercise. Labs/EKG were reviewed/ordered. All questions were answered. LMP 12/14 Colon - Dr Kinnie Scales 2020 GYN/PAP q 12 mo Shingrix Cor calcium CT ordered 2022

## 2020-07-31 ENCOUNTER — Other Ambulatory Visit (INDEPENDENT_AMBULATORY_CARE_PROVIDER_SITE_OTHER): Payer: BC Managed Care – PPO

## 2020-07-31 DIAGNOSIS — Z0000 Encounter for general adult medical examination without abnormal findings: Secondary | ICD-10-CM

## 2020-07-31 DIAGNOSIS — E785 Hyperlipidemia, unspecified: Secondary | ICD-10-CM | POA: Diagnosis not present

## 2020-07-31 LAB — CBC WITH DIFFERENTIAL/PLATELET
Basophils Absolute: 0 10*3/uL (ref 0.0–0.1)
Basophils Relative: 0.7 % (ref 0.0–3.0)
Eosinophils Absolute: 0.1 10*3/uL (ref 0.0–0.7)
Eosinophils Relative: 3.2 % (ref 0.0–5.0)
HCT: 40.2 % (ref 36.0–46.0)
Hemoglobin: 13.6 g/dL (ref 12.0–15.0)
Lymphocytes Relative: 34 % (ref 12.0–46.0)
Lymphs Abs: 1.5 10*3/uL (ref 0.7–4.0)
MCHC: 34 g/dL (ref 30.0–36.0)
MCV: 91.1 fl (ref 78.0–100.0)
Monocytes Absolute: 0.4 10*3/uL (ref 0.1–1.0)
Monocytes Relative: 8.7 % (ref 3.0–12.0)
Neutro Abs: 2.3 10*3/uL (ref 1.4–7.7)
Neutrophils Relative %: 53.4 % (ref 43.0–77.0)
Platelets: 178 10*3/uL (ref 150.0–400.0)
RBC: 4.41 Mil/uL (ref 3.87–5.11)
RDW: 13 % (ref 11.5–15.5)
WBC: 4.3 10*3/uL (ref 4.0–10.5)

## 2020-07-31 LAB — COMPREHENSIVE METABOLIC PANEL
ALT: 15 U/L (ref 0–35)
AST: 18 U/L (ref 0–37)
Albumin: 4.2 g/dL (ref 3.5–5.2)
Alkaline Phosphatase: 73 U/L (ref 39–117)
BUN: 15 mg/dL (ref 6–23)
CO2: 29 mEq/L (ref 19–32)
Calcium: 9.2 mg/dL (ref 8.4–10.5)
Chloride: 101 mEq/L (ref 96–112)
Creatinine, Ser: 0.66 mg/dL (ref 0.40–1.20)
GFR: 94.31 mL/min (ref >60.00)
Glucose, Bld: 93 mg/dL (ref 70–99)
Potassium: 4 mEq/L (ref 3.5–5.1)
Sodium: 137 mEq/L (ref 135–145)
Total Bilirubin: 0.7 mg/dL (ref 0.2–1.2)
Total Protein: 6.7 g/dL (ref 6.0–8.3)

## 2020-07-31 LAB — TSH: TSH: 2.32 u[IU]/mL (ref 0.35–4.50)

## 2020-07-31 LAB — URINALYSIS
Bilirubin Urine: NEGATIVE
Hgb urine dipstick: NEGATIVE
Ketones, ur: NEGATIVE
Leukocytes,Ua: NEGATIVE
Nitrite: NEGATIVE
Specific Gravity, Urine: 1.015 (ref 1.000–1.030)
Total Protein, Urine: NEGATIVE
Urine Glucose: NEGATIVE
Urobilinogen, UA: 0.2 (ref 0.0–1.0)
pH: 8 (ref 5.0–8.0)

## 2020-07-31 LAB — LIPID PANEL
Cholesterol: 180 mg/dL (ref 0–200)
HDL: 53.2 mg/dL (ref >39.00)
LDL Cholesterol: 106 mg/dL — ABNORMAL HIGH (ref 0–99)
NonHDL: 126.97
Total CHOL/HDL Ratio: 3
Triglycerides: 104 mg/dL (ref 0.0–149.0)
VLDL: 20.8 mg/dL (ref 0.0–40.0)

## 2020-08-04 ENCOUNTER — Ambulatory Visit (INDEPENDENT_AMBULATORY_CARE_PROVIDER_SITE_OTHER)
Admission: RE | Admit: 2020-08-04 | Discharge: 2020-08-04 | Disposition: A | Discharge status: Home / Self Care | Payer: Self-pay | Source: Ambulatory Visit | Attending: Internal Medicine | Admitting: Internal Medicine

## 2020-08-04 ENCOUNTER — Other Ambulatory Visit: Payer: Self-pay

## 2020-08-04 DIAGNOSIS — H53002 Unspecified amblyopia, left eye: Secondary | ICD-10-CM | POA: Diagnosis not present

## 2020-08-04 DIAGNOSIS — H524 Presbyopia: Secondary | ICD-10-CM | POA: Diagnosis not present

## 2020-08-04 DIAGNOSIS — E785 Hyperlipidemia, unspecified: Secondary | ICD-10-CM

## 2020-08-04 DIAGNOSIS — H5213 Myopia, bilateral: Secondary | ICD-10-CM | POA: Diagnosis not present

## 2020-08-04 DIAGNOSIS — H25813 Combined forms of age-related cataract, bilateral: Secondary | ICD-10-CM | POA: Diagnosis not present

## 2020-08-04 DIAGNOSIS — H52202 Unspecified astigmatism, left eye: Secondary | ICD-10-CM | POA: Diagnosis not present

## 2020-08-06 ENCOUNTER — Encounter: Payer: Self-pay | Admitting: Internal Medicine

## 2020-08-06 DIAGNOSIS — I700 Atherosclerosis of aorta: Secondary | ICD-10-CM | POA: Insufficient documentation

## 2020-08-06 DIAGNOSIS — I2510 Atherosclerotic heart disease of native coronary artery without angina pectoris: Secondary | ICD-10-CM | POA: Insufficient documentation

## 2020-08-13 DIAGNOSIS — M5137 Other intervertebral disc degeneration, lumbosacral region: Secondary | ICD-10-CM | POA: Diagnosis not present

## 2020-08-13 DIAGNOSIS — M545 Low back pain, unspecified: Secondary | ICD-10-CM | POA: Diagnosis not present

## 2020-08-13 DIAGNOSIS — M9903 Segmental and somatic dysfunction of lumbar region: Secondary | ICD-10-CM | POA: Diagnosis not present

## 2020-08-14 ENCOUNTER — Other Ambulatory Visit: Payer: Self-pay | Admitting: Internal Medicine

## 2020-08-14 DIAGNOSIS — E785 Hyperlipidemia, unspecified: Secondary | ICD-10-CM

## 2020-08-14 MED ORDER — ROSUVASTATIN CALCIUM 5 MG PO TABS: 5.0000 mg | ORAL_TABLET | Freq: Every day | ORAL | 5 refills | Status: DC

## 2020-08-25 MED ORDER — CITALOPRAM HYDROBROMIDE 20 MG PO TABS: 20.0000 mg | ORAL_TABLET | Freq: Every day | ORAL | 3 refills | Status: DC

## 2020-09-21 DIAGNOSIS — M545 Low back pain, unspecified: Secondary | ICD-10-CM | POA: Diagnosis not present

## 2020-09-21 DIAGNOSIS — M9903 Segmental and somatic dysfunction of lumbar region: Secondary | ICD-10-CM | POA: Diagnosis not present

## 2020-09-21 DIAGNOSIS — M5137 Other intervertebral disc degeneration, lumbosacral region: Secondary | ICD-10-CM | POA: Diagnosis not present

## 2020-10-19 DIAGNOSIS — M5137 Other intervertebral disc degeneration, lumbosacral region: Secondary | ICD-10-CM | POA: Diagnosis not present

## 2020-10-19 DIAGNOSIS — M545 Low back pain, unspecified: Secondary | ICD-10-CM | POA: Diagnosis not present

## 2020-10-19 DIAGNOSIS — M9903 Segmental and somatic dysfunction of lumbar region: Secondary | ICD-10-CM | POA: Diagnosis not present

## 2020-11-17 DIAGNOSIS — M545 Low back pain, unspecified: Secondary | ICD-10-CM | POA: Diagnosis not present

## 2020-11-17 DIAGNOSIS — M5137 Other intervertebral disc degeneration, lumbosacral region: Secondary | ICD-10-CM | POA: Diagnosis not present

## 2020-11-17 DIAGNOSIS — M9903 Segmental and somatic dysfunction of lumbar region: Secondary | ICD-10-CM | POA: Diagnosis not present

## 2020-12-02 ENCOUNTER — Other Ambulatory Visit (INDEPENDENT_AMBULATORY_CARE_PROVIDER_SITE_OTHER): Payer: BC Managed Care – PPO

## 2020-12-02 DIAGNOSIS — E785 Hyperlipidemia, unspecified: Secondary | ICD-10-CM

## 2020-12-02 LAB — HEPATIC FUNCTION PANEL
ALT: 44 U/L — ABNORMAL HIGH (ref 0–35)
AST: 37 U/L (ref 0–37)
Albumin: 4.5 g/dL (ref 3.5–5.2)
Alkaline Phosphatase: 84 U/L (ref 39–117)
Bilirubin, Direct: 0.1 mg/dL (ref 0.0–0.3)
Total Bilirubin: 0.5 mg/dL (ref 0.2–1.2)
Total Protein: 7.4 g/dL (ref 6.0–8.3)

## 2020-12-02 LAB — LIPID PANEL
Cholesterol: 175 mg/dL (ref 0–200)
HDL: 73.8 mg/dL (ref >39.00)
LDL Cholesterol: 70 mg/dL (ref 0–99)
NonHDL: 101.19
Total CHOL/HDL Ratio: 2
Triglycerides: 158 mg/dL — ABNORMAL HIGH (ref 0.0–149.0)
VLDL: 31.6 mg/dL (ref 0.0–40.0)

## 2020-12-07 ENCOUNTER — Encounter: Payer: Self-pay | Admitting: Internal Medicine

## 2020-12-07 ENCOUNTER — Other Ambulatory Visit: Payer: Self-pay

## 2020-12-07 ENCOUNTER — Ambulatory Visit (INDEPENDENT_AMBULATORY_CARE_PROVIDER_SITE_OTHER): Payer: BC Managed Care – PPO | Admitting: Internal Medicine

## 2020-12-07 DIAGNOSIS — I2583 Coronary atherosclerosis due to lipid rich plaque: Secondary | ICD-10-CM

## 2020-12-07 DIAGNOSIS — I700 Atherosclerosis of aorta: Secondary | ICD-10-CM | POA: Diagnosis not present

## 2020-12-07 DIAGNOSIS — I2510 Atherosclerotic heart disease of native coronary artery without angina pectoris: Secondary | ICD-10-CM

## 2020-12-07 DIAGNOSIS — F419 Anxiety disorder, unspecified: Secondary | ICD-10-CM | POA: Diagnosis not present

## 2020-12-07 DIAGNOSIS — R42 Dizziness and giddiness: Secondary | ICD-10-CM | POA: Diagnosis not present

## 2020-12-07 MED ORDER — ROSUVASTATIN CALCIUM 5 MG PO TABS: 5.0000 mg | ORAL_TABLET | Freq: Every day | ORAL | 3 refills | Status: DC

## 2020-12-07 MED ORDER — VALACYCLOVIR HCL 1 G PO TABS: ORAL_TABLET | ORAL | 0 refills | Status: DC

## 2020-12-07 NOTE — Assessment & Plan Note (Signed)
Lexapro Xanax prn  Potential benefits of a long term benzodiazepines  use as well as potential risks  and complications were explained to the patient and were aknowledged. 

## 2020-12-07 NOTE — Progress Notes (Signed)
Subjective:  Patient ID: Megan Garner, female    DOB: 1960/09/24  Age: 60 y.o. MRN: [id number]  CC: Follow-up (F/U on labs) and Medication Refill (Valtrex & Rosuvastain)   HPI Megan Garner presents for vertigo - recurrent F/u dyslipidemia     Outpatient Medications Prior to Visit  Medication Sig Dispense Refill   Cholecalciferol (VITAMIN D3) 50 MCG (2000 UT) capsule Take 1 capsule (2,000 Units total) by mouth daily. 100 capsule 3   citalopram (CELEXA) 20 MG tablet Take 1 tablet (20 mg total) by mouth daily. 90 tablet 3   LORazepam (ATIVAN) 1 MG tablet Take 0.5-1 tablets (0.5-1 mg total) by mouth 2 (two) times daily as needed for anxiety or sleep. 60 tablet 2   Omega-3 Fatty Acids (FISH OIL) 1000 MG CAPS Take 1 each by mouth daily.       omeprazole (PRILOSEC) 40 MG capsule Take 1 capsule by mouth every morning before breakfast     rosuvastatin (CRESTOR) 5 MG tablet Take 1 tablet (5 mg total) by mouth daily. 30 tablet 5   valACYclovir (VALTREX) 1000 MG tablet Take 1 by mouth twice a day as needed     No facility-administered medications prior to visit.    ROS: Review of Systems  Constitutional:  Negative for activity change, appetite change, chills, fatigue and unexpected weight change.  HENT:  Positive for sinus pressure. Negative for congestion and mouth sores.   Eyes:  Negative for visual disturbance.  Respiratory:  Negative for cough and chest tightness.   Gastrointestinal:  Negative for abdominal pain and nausea.  Genitourinary:  Negative for difficulty urinating, frequency and vaginal pain.  Musculoskeletal:  Negative for back pain and gait problem.  Skin:  Negative for pallor and rash.  Neurological:  Negative for dizziness, tremors, weakness, numbness and headaches.  Psychiatric/Behavioral:  Negative for confusion and sleep disturbance.    Objective:  BP 112/70 (BP Location: Left Arm)   Pulse 67   Temp 98.1 F (36.7 C) (Oral)   Ht 5\' 9"  (1.753 m)   Wt 171 lb 9.6 oz  (77.8 kg)   LMP 03/24/2011   SpO2 97%   BMI 25.34 kg/m   BP Readings from Last 3 Encounters:  12/07/20 112/70  07/22/20 112/80  06/11/19 116/78    Wt Readings from Last 3 Encounters:  12/07/20 171 lb 9.6 oz (77.8 kg)  07/22/20 163 lb 9.6 oz (74.2 kg)  06/11/19 166 lb (75.3 kg)    Physical Exam Constitutional:      General: She is not in acute distress.    Appearance: She is well-developed.  HENT:     Head: Normocephalic.     Right Ear: External ear normal.     Left Ear: External ear normal.     Nose: Nose normal.  Eyes:     General:        Right eye: No discharge.        Left eye: No discharge.     Conjunctiva/sclera: Conjunctivae normal.     Pupils: Pupils are equal, round, and reactive to light.  Neck:     Thyroid: No thyromegaly.     Vascular: No JVD.     Trachea: No tracheal deviation.  Cardiovascular:     Rate and Rhythm: Normal rate and regular rhythm.     Heart sounds: Normal heart sounds.  Pulmonary:     Effort: No respiratory distress.     Breath sounds: No stridor. No wheezing.  Abdominal:  General: Bowel sounds are normal. There is no distension.     Palpations: Abdomen is soft. There is no mass.     Tenderness: There is no abdominal tenderness. There is no guarding or rebound.  Musculoskeletal:        General: No tenderness.     Cervical back: Normal range of motion and neck supple. No rigidity.  Lymphadenopathy:     Cervical: No cervical adenopathy.  Skin:    Findings: No erythema or rash.  Neurological:     Cranial Nerves: No cranial nerve deficit.     Motor: No abnormal muscle tone.     Coordination: Coordination normal.     Deep Tendon Reflexes: Reflexes normal.  Psychiatric:        Behavior: Behavior normal.        Thought Content: Thought content normal.        Judgment: Judgment normal.    Lab Results  Component Value Date   WBC 4.3 07/31/2020   HGB 13.6 07/31/2020   HCT 40.2 07/31/2020   PLT 178.0 07/31/2020   GLUCOSE 93  07/31/2020   CHOL 175 12/02/2020   TRIG 158.0 (H) 12/02/2020   HDL 73.80 12/02/2020   LDLCALC 70 12/02/2020   ALT 44 (H) 12/02/2020   AST 37 12/02/2020   NA 137 07/31/2020   K 4.0 07/31/2020   CL 101 07/31/2020   CREATININE 0.66 07/31/2020   BUN 15 07/31/2020   CO2 29 07/31/2020   TSH 2.32 07/31/2020    CT CARDIAC SCORING (SELF PAY ONLY)  Addendum Date: 08/05/2020   ADDENDUM REPORT: 08/05/2020 08:50 EXAM: OVER-READ INTERPRETATION  CT CHEST The following report is an over-read performed by radiologist Dr. Minerva Fester [facility] on 08/05/2020. This over-read does not include interpretation of cardiac or coronary anatomy or pathology. Chest imaging extends from the mid chest through the lung bases with limited coverage for coronary calcium evaluation. The coronary calcium interpretation by the cardiologist is attached. COMPARISON:  None FINDINGS: Cardiovascular: See dedicated cardiac read for further detail regarding cardiac structures. Signs of minimal aortic atherosclerosis are noted along with calcified coronary artery disease. No substantial pericardial effusion. Mediastinum/Nodes: Visualized mediastinal structures without signs of adenopathy. Limited assessment of the chest as outlined above Lungs/Pleura: 4 mm pulmonary nodule RIGHT upper lobe (image 25/3) Small ill-defined RIGHT middle lobe nodules (image 33/3) and image 28/3 in the lateral RIGHT middle lobe measuring 4-5 mm. LEFT lower lobe pulmonary nodule (image 29/3) 5 mm. No effusion or consolidation in the visualized chest. Upper Abdomen: Incidental imaging of upper abdominal contents is unremarkable. Musculoskeletal: No acute or destructive bone process. IMPRESSION: No acute findings. Small pulmonary nodules at 5 mm or less in the LEFT and RIGHT chest. No follow-up needed if patient is low-risk (and has no known or suspected primary neoplasm). Non-contrast chest CT can be considered in 12 months if patient is high-risk.  This recommendation follows the consensus statement: Guidelines for Management of Incidental Pulmonary Nodules Detected on CT Images: From the Fleischner Society 2017; Radiology 2017; 284:228-243. Minimal aortic atherosclerosis. Aortic Atherosclerosis (ICD10-I70.0). Electronically Signed   By: Zetta Bills M.D.   On: 08/05/2020 08:50   Result Date: 08/05/2020 CLINICAL DATA:  Cardiovascular Disease Risk stratification EXAM: Coronary Calcium Score TECHNIQUE: A gated, non-contrast computed tomography scan of the heart was performed using 83mm slice thickness. Axial images were analyzed on a dedicated workstation. Calcium scoring of the coronary arteries was performed using the Agatston method. FINDINGS: Coronary Calcium Score: Left  main: 0 Left anterior descending artery: 35.4 Left circumflex artery: 0 Right coronary artery: 0 Total: 35.4 Percentile: 79 Pericardium: Normal. Ascending Aorta: Aortic root mildly dilated (40 mm). Non-cardiac: See separate report from [facility]. IMPRESSION: Coronary calcium score of 35.4. This was 16 percentile for age-, race-, and sex-matched controls. Mildly dilated aortic root (40 mm). RECOMMENDATIONS: Coronary artery calcium (CAC) score is a strong predictor of incident coronary heart disease (CHD) and provides predictive information beyond traditional risk factors. CAC scoring is reasonable to use in the decision to withhold, postpone, or initiate statin therapy in intermediate-risk or selected borderline-risk asymptomatic adults (age 22-75 years and LDL-C >=70 to <190 mg/dL) who do not have diabetes or established atherosclerotic cardiovascular disease (ASCVD).* In intermediate-risk (10-year ASCVD risk >=7.5% to <20%) adults or selected borderline-risk (10-year ASCVD risk >=5% to <7.5%) adults in whom a CAC score is measured for the purpose of making a treatment decision the following recommendations have been made: If CAC=0, it is reasonable to withhold statin therapy  and reassess in 5 to 10 years, as long as higher risk conditions are absent (diabetes mellitus, family history of premature CHD in first degree relatives (males <55 years; females <65 years), cigarette smoking, or LDL >=190 mg/dL). If CAC is 1 to 99, it is reasonable to initiate statin therapy for patients >=53 years of age. If CAC is >=100 or >=75th percentile, it is reasonable to initiate statin therapy at any age. Cardiology referral should be considered for patients with CAC scores >=400 or >=75th percentile. *2018 AHA/ACC/AACVPR/AAPA/ABC/ACPM/ADA/AGS/APhA/ASPC/NLA/PCNA Guideline on the Management of Blood Cholesterol: A Report of the American College of Cardiology/American Heart Association Task Force on Clinical Practice Guidelines. J Am Coll Cardiol. 2019;73(24):3168-3209. Kirk Ruths, MD Electronically Signed: By: Kirk Ruths M.D. On: 08/04/2020 14:40    Assessment & Plan:   Problem List Items Addressed This Visit     Anxiety disorder    Lexapro Xanax prn  Potential benefits of a long term benzodiazepines  use as well as potential risks  and complications were explained to the patient and were aknowledged.      Vertigo    Benign Positional Vertigo symptoms on the left. Start Meclizine. Start Laruth Bouchard - Daroff exercise several times a day as dirrected.          Meds ordered this encounter  Medications   valACYclovir (VALTREX) 1000 MG tablet    Sig: Take 1 by mouth twice a day x 2 days as needed    Dispense:  30 tablet    Refill:  0   rosuvastatin (CRESTOR) 5 MG tablet    Sig: Take 1 tablet (5 mg total) by mouth daily.    Dispense:  90 tablet    Refill:  3      Follow-up: Return for Wellness Exam.  Walker Kehr, MD

## 2020-12-07 NOTE — Assessment & Plan Note (Signed)
Benign Positional Vertigo symptoms on the left. Start Meclizine. Start Brandt - Daroff exercise several times a day as dirrected. 

## 2020-12-12 NOTE — Assessment & Plan Note (Signed)
On Crestor, diet and exercise 

## 2020-12-21 DIAGNOSIS — M5137 Other intervertebral disc degeneration, lumbosacral region: Secondary | ICD-10-CM | POA: Diagnosis not present

## 2020-12-21 DIAGNOSIS — M545 Low back pain, unspecified: Secondary | ICD-10-CM | POA: Diagnosis not present

## 2020-12-21 DIAGNOSIS — M9903 Segmental and somatic dysfunction of lumbar region: Secondary | ICD-10-CM | POA: Diagnosis not present

## 2020-12-30 ENCOUNTER — Other Ambulatory Visit: Payer: Self-pay | Admitting: Internal Medicine

## 2021-01-15 DIAGNOSIS — F4310 Post-traumatic stress disorder, unspecified: Secondary | ICD-10-CM | POA: Diagnosis not present

## 2021-01-19 ENCOUNTER — Other Ambulatory Visit: Payer: Self-pay | Admitting: Internal Medicine

## 2021-01-25 DIAGNOSIS — M5137 Other intervertebral disc degeneration, lumbosacral region: Secondary | ICD-10-CM | POA: Diagnosis not present

## 2021-01-25 DIAGNOSIS — M9903 Segmental and somatic dysfunction of lumbar region: Secondary | ICD-10-CM | POA: Diagnosis not present

## 2021-01-25 DIAGNOSIS — M545 Low back pain, unspecified: Secondary | ICD-10-CM | POA: Diagnosis not present

## 2021-02-01 ENCOUNTER — Other Ambulatory Visit: Payer: Self-pay | Admitting: Internal Medicine

## 2021-02-11 ENCOUNTER — Encounter: Payer: Self-pay | Admitting: Internal Medicine

## 2021-02-11 DIAGNOSIS — R350 Frequency of micturition: Secondary | ICD-10-CM | POA: Diagnosis not present

## 2021-02-11 DIAGNOSIS — R300 Dysuria: Secondary | ICD-10-CM | POA: Diagnosis not present

## 2021-02-11 DIAGNOSIS — N3001 Acute cystitis with hematuria: Secondary | ICD-10-CM | POA: Diagnosis not present

## 2021-02-11 NOTE — Telephone Encounter (Signed)
Pt has sent MD a mychart msg.. closing this encounter.Marland KitchenRaechel Chute

## 2021-02-12 ENCOUNTER — Other Ambulatory Visit: Payer: Self-pay | Admitting: Internal Medicine

## 2021-02-12 MED ORDER — NITROFURANTOIN MONOHYD MACRO 100 MG PO CAPS: 100.0000 mg | ORAL_CAPSULE | Freq: Two times a day (BID) | ORAL | 0 refills | Status: DC

## 2021-02-16 DIAGNOSIS — N3289 Other specified disorders of bladder: Secondary | ICD-10-CM | POA: Diagnosis not present

## 2021-02-23 DIAGNOSIS — M545 Low back pain, unspecified: Secondary | ICD-10-CM | POA: Diagnosis not present

## 2021-02-23 DIAGNOSIS — M9903 Segmental and somatic dysfunction of lumbar region: Secondary | ICD-10-CM | POA: Diagnosis not present

## 2021-02-23 DIAGNOSIS — M5137 Other intervertebral disc degeneration, lumbosacral region: Secondary | ICD-10-CM | POA: Diagnosis not present

## 2021-03-12 ENCOUNTER — Other Ambulatory Visit: Payer: Self-pay | Admitting: Internal Medicine

## 2021-03-12 DIAGNOSIS — N3289 Other specified disorders of bladder: Secondary | ICD-10-CM | POA: Diagnosis not present

## 2021-03-12 DIAGNOSIS — Z202 Contact with and (suspected) exposure to infections with a predominantly sexual mode of transmission: Secondary | ICD-10-CM | POA: Diagnosis not present

## 2021-03-29 DIAGNOSIS — M5137 Other intervertebral disc degeneration, lumbosacral region: Secondary | ICD-10-CM | POA: Diagnosis not present

## 2021-03-29 DIAGNOSIS — M545 Low back pain, unspecified: Secondary | ICD-10-CM | POA: Diagnosis not present

## 2021-03-29 DIAGNOSIS — M9903 Segmental and somatic dysfunction of lumbar region: Secondary | ICD-10-CM | POA: Diagnosis not present

## 2021-04-04 ENCOUNTER — Emergency Department
Admission: RE | Admit: 2021-04-04 | Discharge: 2021-04-04 | Disposition: A | Discharge status: Home / Self Care | Payer: BC Managed Care – PPO | Source: Ambulatory Visit

## 2021-04-04 ENCOUNTER — Other Ambulatory Visit: Payer: Self-pay

## 2021-04-04 VITALS — BP 126/76 | HR 70 | Temp 98.6°F | Resp 20 | Ht 69.0 in | Wt 168.0 lb

## 2021-04-04 DIAGNOSIS — T162XXA Foreign body in left ear, initial encounter: Secondary | ICD-10-CM

## 2021-04-04 HISTORY — DX: Melanoma in situ, unspecified: D03.9

## 2021-04-04 NOTE — ED Triage Notes (Signed)
Pt presents to Urgent Care with c/o having the cotton swab of Q-tip lodged in L ear x 3 days.

## 2021-04-04 NOTE — ED Provider Notes (Signed)
[facility]    CSN: [id number] Arrival date & time: 04/04/21  1303      History   Chief Complaint Chief Complaint  Patient presents with   Foreign Body in Ear    HPI Megan Garner is a 61 y.o. female.   HPI 61 year old female presents with cotton swab/tip of Q-tip lodged in left ear for 3 days.  PMH significant for asthma and melanoma in situ.  Past Medical History:  Diagnosis Date   Allergy    cyclical cough   Asthma    Melanoma in situ [facility])     Patient Active Problem List   Diagnosis Date Noted   Vertigo 12/07/2020   Atherosclerosis of aorta ([place]) 08/06/2020   Coronary atherosclerosis 08/06/2020   Grief 02/24/2017   Anxiety disorder 01/06/2017   Well adult exam 08/27/2013   Warts 04/24/2011   UPPER RESPIRATORY INFECTION ([place]) 02/13/2007   Allergic rhinitis 02/13/2007   Asthma 02/13/2007    Past Surgical History:  Procedure Laterality Date   FACIAL COSMETIC SURGERY      OB History   No obstetric history on file.      Home Medications    Prior to Admission medications   Medication Sig Start Date End Date Taking? Authorizing Provider  esomeprazole (NEXIUM) 20 MG capsule Take 20 mg by mouth daily at 12 noon.   Yes [provider]  Cholecalciferol (VITAMIN D3) 50 MCG (2000 UT) capsule Take 1 capsule (2,000 Units total) by mouth daily. 06/11/19   Plotnikov, Evie Lacks, MD  citalopram (CELEXA) 20 MG tablet Take 1 tablet (20 mg total) by mouth daily. 08/25/20   Plotnikov, Evie Lacks, MD  LORazepam (ATIVAN) 1 MG tablet Take 0.5-1 tablets (0.5-1 mg total) by mouth 2 (two) times daily as needed for anxiety or sleep. 07/22/20   Plotnikov, Evie Lacks, MD  nitrofurantoin, macrocrystal-monohydrate, (MACROBID) 100 MG capsule Take 1 capsule (100 mg total) by mouth 2 (two) times daily. 02/12/21   Plotnikov, Evie Lacks, MD  Omega-3 Fatty Acids (FISH OIL) 1000 MG CAPS Take 1 each by mouth daily.      [provider]  omeprazole (PRILOSEC) 40 MG capsule  Take 1 capsule by mouth every morning before breakfast    [provider]  rosuvastatin (CRESTOR) 5 MG tablet Take 1 tablet (5 mg total) by mouth daily. 12/07/20   Plotnikov, Evie Lacks, MD  valACYclovir (VALTREX) 1000 MG tablet TAKE 1 BY MOUTH TWICE A DAY X 2 DAYS AS NEEDED 01/19/21   Plotnikov, Evie Lacks, MD    Family History Family History  Problem Relation Age of Onset   Hypertension Mother    Arthritis Mother        gout   Heart disease Mother 37       STENT   Cancer Father 78       colon ca   Hypertension Other     Social History Social History   Tobacco Use   Smoking status: Never   Smokeless tobacco: Never  Vaping Use   Vaping Use: Never used  Substance Use Topics   Alcohol use: Yes    Alcohol/week: 10.0 standard drinks    Types: 10 Glasses of wine per week    Comment: weekly   Drug use: No     Allergies   Patient has no known allergies.   Review of Systems Review of Systems  HENT:         Foreign body (tip of Q-tip) in the left  ear x3 days  All other systems reviewed and are negative.   Physical Exam Triage Vital Signs ED Triage Vitals  Enc Vitals Group     BP 04/04/21 1330 126/76     Pulse Rate 04/04/21 1330 70     Resp 04/04/21 1330 20     Temp 04/04/21 1330 98.6 F (37 C)     Temp Source 04/04/21 1330 Oral     SpO2 04/04/21 1330 97 %     Weight 04/04/21 1324 168 lb (76.2 kg)     Height 04/04/21 1324 5\' 9"  (1.753 m)     Head Circumference --      Peak Flow --      Pain Score 04/04/21 1324 0     Pain Loc --      Pain Edu? --      Excl. in [place]? --    No data found.  Updated Vital Signs BP 126/76 (BP Location: Right Arm)    Pulse 70    Temp 98.6 F (37 C) (Oral)    Resp 20    Ht 5\' 9"  (1.753 m)    Wt 168 lb (76.2 kg)    LMP 03/24/2011    SpO2 97%    BMI 24.81 kg/m   Physical Exam Vitals and nursing note reviewed.  Constitutional:      General: She is not in acute distress.    Appearance: Normal appearance. She is normal  weight. She is not ill-appearing.  HENT:     Head: Normocephalic and atraumatic.     Right Ear: Tympanic membrane, ear canal and external ear normal.     Ears:     Comments: Left EAC: Cotton tip of Q-tip visualized proximal to TM; alligator forceps used successfully to remove cotton tip from left EAC; left TM-clear with good light reflex    Mouth/Throat:     Mouth: Mucous membranes are moist.     Pharynx: Oropharynx is clear.  Eyes:     Extraocular Movements: Extraocular movements intact.     Conjunctiva/sclera: Conjunctivae normal.     Pupils: Pupils are equal, round, and reactive to light.  Cardiovascular:     Rate and Rhythm: Normal rate and regular rhythm.     Pulses: Normal pulses.     Heart sounds: Normal heart sounds.  Pulmonary:     Effort: Pulmonary effort is normal.     Breath sounds: Normal breath sounds.  Skin:    General: Skin is warm and dry.  Neurological:     General: No focal deficit present.     Mental Status: She is alert and oriented to person, place, and time.     UC Treatments / Results  Labs (all labs ordered are listed, but only abnormal results are displayed) Labs Reviewed - No data to display  EKG   Radiology No results found.  Procedures Procedures (including critical care time)  Medications Ordered in UC Medications - No data to display  Initial Impression / Assessment and Plan / UC Course  I have reviewed the triage vital signs and the nursing notes.  Pertinent labs & imaging results that were available during my care of the patient were reviewed by me and considered in my medical decision making (see chart for details).     MDM: 1.  Foreign body of left ear, initial encounter-Q-tip/cotton tip (1.0 cm) removed successfully from left EAC with alligator forceps.  Patient discharged home, hemodynamically stable. Final Clinical Impressions(s) / UC Diagnoses  Final diagnoses:  Foreign body of left ear, initial encounter      Discharge Instructions      Q-tip moved successfully from left EAC.     ED Prescriptions   None    PDMP not reviewed this encounter.   Eliezer Lofts, [place] 04/04/21 1415

## 2021-04-04 NOTE — Discharge Instructions (Addendum)
Q-tip moved successfully from left EAC.

## 2021-04-25 DIAGNOSIS — U071 COVID-19: Secondary | ICD-10-CM | POA: Diagnosis not present

## 2021-04-29 DIAGNOSIS — M9903 Segmental and somatic dysfunction of lumbar region: Secondary | ICD-10-CM | POA: Diagnosis not present

## 2021-04-29 DIAGNOSIS — M545 Low back pain, unspecified: Secondary | ICD-10-CM | POA: Diagnosis not present

## 2021-04-29 DIAGNOSIS — M5137 Other intervertebral disc degeneration, lumbosacral region: Secondary | ICD-10-CM | POA: Diagnosis not present

## 2021-05-09 ENCOUNTER — Other Ambulatory Visit: Payer: Self-pay | Admitting: Internal Medicine

## 2021-05-19 DIAGNOSIS — Z0142 Encounter for cervical smear to confirm findings of recent normal smear following initial abnormal smear: Secondary | ICD-10-CM | POA: Diagnosis not present

## 2021-05-19 DIAGNOSIS — Z780 Asymptomatic menopausal state: Secondary | ICD-10-CM | POA: Diagnosis not present

## 2021-05-19 DIAGNOSIS — Z130 Encounter for screening for diseases of the blood and blood-forming organs and certain disorders involving the immune mechanism: Secondary | ICD-10-CM | POA: Diagnosis not present

## 2021-05-19 DIAGNOSIS — Z1151 Encounter for screening for human papillomavirus (HPV): Secondary | ICD-10-CM | POA: Diagnosis not present

## 2021-05-19 DIAGNOSIS — Z124 Encounter for screening for malignant neoplasm of cervix: Secondary | ICD-10-CM | POA: Diagnosis not present

## 2021-05-19 DIAGNOSIS — Z1272 Encounter for screening for malignant neoplasm of vagina: Secondary | ICD-10-CM | POA: Diagnosis not present

## 2021-05-19 DIAGNOSIS — R87810 Cervical high risk human papillomavirus (HPV) DNA test positive: Secondary | ICD-10-CM | POA: Diagnosis not present

## 2021-05-19 DIAGNOSIS — Z01419 Encounter for gynecological examination (general) (routine) without abnormal findings: Secondary | ICD-10-CM | POA: Diagnosis not present

## 2021-05-19 DIAGNOSIS — Z1231 Encounter for screening mammogram for malignant neoplasm of breast: Secondary | ICD-10-CM | POA: Diagnosis not present

## 2021-05-23 DIAGNOSIS — E782 Mixed hyperlipidemia: Secondary | ICD-10-CM | POA: Insufficient documentation

## 2021-05-23 DIAGNOSIS — M5440 Lumbago with sciatica, unspecified side: Secondary | ICD-10-CM | POA: Diagnosis not present

## 2021-05-26 DIAGNOSIS — M5137 Other intervertebral disc degeneration, lumbosacral region: Secondary | ICD-10-CM | POA: Diagnosis not present

## 2021-05-26 DIAGNOSIS — M9903 Segmental and somatic dysfunction of lumbar region: Secondary | ICD-10-CM | POA: Diagnosis not present

## 2021-05-26 DIAGNOSIS — M545 Low back pain, unspecified: Secondary | ICD-10-CM | POA: Diagnosis not present

## 2021-05-31 DIAGNOSIS — M545 Low back pain, unspecified: Secondary | ICD-10-CM | POA: Diagnosis not present

## 2021-05-31 DIAGNOSIS — M5137 Other intervertebral disc degeneration, lumbosacral region: Secondary | ICD-10-CM | POA: Diagnosis not present

## 2021-05-31 DIAGNOSIS — M9903 Segmental and somatic dysfunction of lumbar region: Secondary | ICD-10-CM | POA: Diagnosis not present

## 2021-06-03 DIAGNOSIS — M9903 Segmental and somatic dysfunction of lumbar region: Secondary | ICD-10-CM | POA: Diagnosis not present

## 2021-06-03 DIAGNOSIS — M5137 Other intervertebral disc degeneration, lumbosacral region: Secondary | ICD-10-CM | POA: Diagnosis not present

## 2021-06-03 DIAGNOSIS — M545 Low back pain, unspecified: Secondary | ICD-10-CM | POA: Diagnosis not present

## 2021-06-08 DIAGNOSIS — M545 Low back pain, unspecified: Secondary | ICD-10-CM | POA: Diagnosis not present

## 2021-06-08 DIAGNOSIS — M5137 Other intervertebral disc degeneration, lumbosacral region: Secondary | ICD-10-CM | POA: Diagnosis not present

## 2021-06-08 DIAGNOSIS — M9903 Segmental and somatic dysfunction of lumbar region: Secondary | ICD-10-CM | POA: Diagnosis not present

## 2021-06-10 DIAGNOSIS — M5137 Other intervertebral disc degeneration, lumbosacral region: Secondary | ICD-10-CM | POA: Diagnosis not present

## 2021-06-10 DIAGNOSIS — M9903 Segmental and somatic dysfunction of lumbar region: Secondary | ICD-10-CM | POA: Diagnosis not present

## 2021-06-10 DIAGNOSIS — M545 Low back pain, unspecified: Secondary | ICD-10-CM | POA: Diagnosis not present

## 2021-06-14 DIAGNOSIS — M5137 Other intervertebral disc degeneration, lumbosacral region: Secondary | ICD-10-CM | POA: Diagnosis not present

## 2021-06-14 DIAGNOSIS — M545 Low back pain, unspecified: Secondary | ICD-10-CM | POA: Diagnosis not present

## 2021-06-14 DIAGNOSIS — M9903 Segmental and somatic dysfunction of lumbar region: Secondary | ICD-10-CM | POA: Diagnosis not present

## 2021-06-17 DIAGNOSIS — M9903 Segmental and somatic dysfunction of lumbar region: Secondary | ICD-10-CM | POA: Diagnosis not present

## 2021-06-17 DIAGNOSIS — M545 Low back pain, unspecified: Secondary | ICD-10-CM | POA: Diagnosis not present

## 2021-06-17 DIAGNOSIS — M5137 Other intervertebral disc degeneration, lumbosacral region: Secondary | ICD-10-CM | POA: Diagnosis not present

## 2021-06-22 DIAGNOSIS — M545 Low back pain, unspecified: Secondary | ICD-10-CM | POA: Diagnosis not present

## 2021-06-22 DIAGNOSIS — M9903 Segmental and somatic dysfunction of lumbar region: Secondary | ICD-10-CM | POA: Diagnosis not present

## 2021-06-22 DIAGNOSIS — M5137 Other intervertebral disc degeneration, lumbosacral region: Secondary | ICD-10-CM | POA: Diagnosis not present

## 2021-06-25 ENCOUNTER — Encounter: Payer: Self-pay | Admitting: Internal Medicine

## 2021-06-25 DIAGNOSIS — E785 Hyperlipidemia, unspecified: Secondary | ICD-10-CM

## 2021-06-25 DIAGNOSIS — I2510 Atherosclerotic heart disease of native coronary artery without angina pectoris: Secondary | ICD-10-CM

## 2021-06-25 DIAGNOSIS — Z0000 Encounter for general adult medical examination without abnormal findings: Secondary | ICD-10-CM

## 2021-06-25 NOTE — Addendum Note (Signed)
Addended by: Deatra James on: 06/25/2021 04:30 PM   Modules accepted: Orders

## 2021-07-01 DIAGNOSIS — M9903 Segmental and somatic dysfunction of lumbar region: Secondary | ICD-10-CM | POA: Diagnosis not present

## 2021-07-01 DIAGNOSIS — M545 Low back pain, unspecified: Secondary | ICD-10-CM | POA: Diagnosis not present

## 2021-07-01 DIAGNOSIS — M5137 Other intervertebral disc degeneration, lumbosacral region: Secondary | ICD-10-CM | POA: Diagnosis not present

## 2021-07-06 DIAGNOSIS — M5137 Other intervertebral disc degeneration, lumbosacral region: Secondary | ICD-10-CM | POA: Diagnosis not present

## 2021-07-06 DIAGNOSIS — M545 Low back pain, unspecified: Secondary | ICD-10-CM | POA: Diagnosis not present

## 2021-07-06 DIAGNOSIS — M9903 Segmental and somatic dysfunction of lumbar region: Secondary | ICD-10-CM | POA: Diagnosis not present

## 2021-07-08 ENCOUNTER — Other Ambulatory Visit: Payer: Self-pay | Admitting: Internal Medicine

## 2021-07-12 NOTE — Addendum Note (Signed)
Addended by: Deatra James on: 07/12/2021 09:43 AM   Modules accepted: Orders

## 2021-07-13 ENCOUNTER — Other Ambulatory Visit (INDEPENDENT_AMBULATORY_CARE_PROVIDER_SITE_OTHER): Payer: BC Managed Care – PPO

## 2021-07-13 DIAGNOSIS — I2510 Atherosclerotic heart disease of native coronary artery without angina pectoris: Secondary | ICD-10-CM | POA: Diagnosis not present

## 2021-07-13 DIAGNOSIS — Z0000 Encounter for general adult medical examination without abnormal findings: Secondary | ICD-10-CM

## 2021-07-13 DIAGNOSIS — E785 Hyperlipidemia, unspecified: Secondary | ICD-10-CM | POA: Diagnosis not present

## 2021-07-13 DIAGNOSIS — I2583 Coronary atherosclerosis due to lipid rich plaque: Secondary | ICD-10-CM | POA: Diagnosis not present

## 2021-07-13 LAB — CBC WITH DIFFERENTIAL/PLATELET
Basophils Absolute: 0 10*3/uL (ref 0.0–0.1)
Basophils Relative: 0.8 % (ref 0.0–3.0)
Eosinophils Absolute: 0.1 10*3/uL (ref 0.0–0.7)
Eosinophils Relative: 3.4 % (ref 0.0–5.0)
HCT: 41 % (ref 36.0–46.0)
Hemoglobin: 13.7 g/dL (ref 12.0–15.0)
Lymphocytes Relative: 33.8 % (ref 12.0–46.0)
Lymphs Abs: 1.3 10*3/uL (ref 0.7–4.0)
MCHC: 33.4 g/dL (ref 30.0–36.0)
MCV: 92.1 fl (ref 78.0–100.0)
Monocytes Absolute: 0.4 10*3/uL (ref 0.1–1.0)
Monocytes Relative: 9.1 % (ref 3.0–12.0)
Neutro Abs: 2.1 10*3/uL (ref 1.4–7.7)
Neutrophils Relative %: 52.9 % (ref 43.0–77.0)
Platelets: 189 10*3/uL (ref 150.0–400.0)
RBC: 4.45 Mil/uL (ref 3.87–5.11)
RDW: 12.9 % (ref 11.5–15.5)
WBC: 4 10*3/uL (ref 4.0–10.5)

## 2021-07-13 LAB — COMPREHENSIVE METABOLIC PANEL
ALT: 31 U/L (ref 0–35)
AST: 27 U/L (ref 0–37)
Albumin: 4.4 g/dL (ref 3.5–5.2)
Alkaline Phosphatase: 77 U/L (ref 39–117)
BUN: 18 mg/dL (ref 6–23)
CO2: 29 mEq/L (ref 19–32)
Calcium: 9.7 mg/dL (ref 8.4–10.5)
Chloride: 101 mEq/L (ref 96–112)
Creatinine, Ser: 0.68 mg/dL (ref 0.40–1.20)
GFR: 93.01 mL/min (ref >60.00)
Glucose, Bld: 99 mg/dL (ref 70–99)
Potassium: 4.3 mEq/L (ref 3.5–5.1)
Sodium: 138 mEq/L (ref 135–145)
Total Bilirubin: 0.8 mg/dL (ref 0.2–1.2)
Total Protein: 7.3 g/dL (ref 6.0–8.3)

## 2021-07-13 LAB — URINALYSIS, ROUTINE W REFLEX MICROSCOPIC
Bilirubin Urine: NEGATIVE
Hgb urine dipstick: NEGATIVE
Ketones, ur: NEGATIVE
Nitrite: NEGATIVE
RBC / HPF: NONE SEEN (ref 0–?)
Specific Gravity, Urine: 1.015 (ref 1.000–1.030)
Total Protein, Urine: NEGATIVE
Urine Glucose: NEGATIVE
Urobilinogen, UA: 0.2 (ref 0.0–1.0)
pH: 7.5 (ref 5.0–8.0)

## 2021-07-13 LAB — BASIC METABOLIC PANEL
BUN: 18 mg/dL (ref 6–23)
CO2: 29 mEq/L (ref 19–32)
Calcium: 9.7 mg/dL (ref 8.4–10.5)
Chloride: 101 mEq/L (ref 96–112)
Creatinine, Ser: 0.68 mg/dL (ref 0.40–1.20)
GFR: 93.01 mL/min (ref >60.00)
Glucose, Bld: 99 mg/dL (ref 70–99)
Potassium: 4.3 mEq/L (ref 3.5–5.1)
Sodium: 138 mEq/L (ref 135–145)

## 2021-07-13 LAB — LIPID PANEL
Cholesterol: 162 mg/dL (ref 0–200)
HDL: 67 mg/dL (ref >39.00)
LDL Cholesterol: 80 mg/dL (ref 0–99)
NonHDL: 95.11
Total CHOL/HDL Ratio: 2
Triglycerides: 77 mg/dL (ref 0.0–149.0)
VLDL: 15.4 mg/dL (ref 0.0–40.0)

## 2021-07-13 LAB — TSH: TSH: 2.3 u[IU]/mL (ref 0.35–5.50)

## 2021-07-15 DIAGNOSIS — M545 Low back pain, unspecified: Secondary | ICD-10-CM | POA: Diagnosis not present

## 2021-07-15 DIAGNOSIS — M9903 Segmental and somatic dysfunction of lumbar region: Secondary | ICD-10-CM | POA: Diagnosis not present

## 2021-07-15 DIAGNOSIS — M5137 Other intervertebral disc degeneration, lumbosacral region: Secondary | ICD-10-CM | POA: Diagnosis not present

## 2021-07-20 DIAGNOSIS — N879 Dysplasia of cervix uteri, unspecified: Secondary | ICD-10-CM | POA: Diagnosis not present

## 2021-07-20 DIAGNOSIS — R87619 Unspecified abnormal cytological findings in specimens from cervix uteri: Secondary | ICD-10-CM | POA: Diagnosis not present

## 2021-07-22 DIAGNOSIS — M5137 Other intervertebral disc degeneration, lumbosacral region: Secondary | ICD-10-CM | POA: Diagnosis not present

## 2021-07-22 DIAGNOSIS — M545 Low back pain, unspecified: Secondary | ICD-10-CM | POA: Diagnosis not present

## 2021-07-22 DIAGNOSIS — M9903 Segmental and somatic dysfunction of lumbar region: Secondary | ICD-10-CM | POA: Diagnosis not present

## 2021-07-28 ENCOUNTER — Encounter: Payer: BC Managed Care – PPO | Admitting: Internal Medicine

## 2021-07-29 DIAGNOSIS — M9903 Segmental and somatic dysfunction of lumbar region: Secondary | ICD-10-CM | POA: Diagnosis not present

## 2021-07-29 DIAGNOSIS — M545 Low back pain, unspecified: Secondary | ICD-10-CM | POA: Diagnosis not present

## 2021-07-29 DIAGNOSIS — M5137 Other intervertebral disc degeneration, lumbosacral region: Secondary | ICD-10-CM | POA: Diagnosis not present

## 2021-08-12 DIAGNOSIS — M9903 Segmental and somatic dysfunction of lumbar region: Secondary | ICD-10-CM | POA: Diagnosis not present

## 2021-08-12 DIAGNOSIS — M5137 Other intervertebral disc degeneration, lumbosacral region: Secondary | ICD-10-CM | POA: Diagnosis not present

## 2021-08-12 DIAGNOSIS — M545 Low back pain, unspecified: Secondary | ICD-10-CM | POA: Diagnosis not present

## 2021-08-26 DIAGNOSIS — M545 Low back pain, unspecified: Secondary | ICD-10-CM | POA: Diagnosis not present

## 2021-08-26 DIAGNOSIS — M9903 Segmental and somatic dysfunction of lumbar region: Secondary | ICD-10-CM | POA: Diagnosis not present

## 2021-08-26 DIAGNOSIS — M5137 Other intervertebral disc degeneration, lumbosacral region: Secondary | ICD-10-CM | POA: Diagnosis not present

## 2021-09-13 ENCOUNTER — Ambulatory Visit (INDEPENDENT_AMBULATORY_CARE_PROVIDER_SITE_OTHER): Payer: BC Managed Care – PPO | Admitting: Internal Medicine

## 2021-09-13 ENCOUNTER — Encounter: Payer: Self-pay | Admitting: Internal Medicine

## 2021-09-13 VITALS — BP 114/76 | HR 64 | Temp 97.6°F | Ht 69.0 in | Wt 169.0 lb

## 2021-09-13 DIAGNOSIS — Z23 Encounter for immunization: Secondary | ICD-10-CM | POA: Diagnosis not present

## 2021-09-13 DIAGNOSIS — N952 Postmenopausal atrophic vaginitis: Secondary | ICD-10-CM | POA: Insufficient documentation

## 2021-09-13 DIAGNOSIS — L235 Allergic contact dermatitis due to other chemical products: Secondary | ICD-10-CM | POA: Diagnosis not present

## 2021-09-13 DIAGNOSIS — I700 Atherosclerosis of aorta: Secondary | ICD-10-CM

## 2021-09-13 DIAGNOSIS — I2583 Coronary atherosclerosis due to lipid rich plaque: Secondary | ICD-10-CM

## 2021-09-13 DIAGNOSIS — Z0000 Encounter for general adult medical examination without abnormal findings: Secondary | ICD-10-CM

## 2021-09-13 DIAGNOSIS — J301 Allergic rhinitis due to pollen: Secondary | ICD-10-CM | POA: Diagnosis not present

## 2021-09-13 DIAGNOSIS — Z780 Asymptomatic menopausal state: Secondary | ICD-10-CM | POA: Insufficient documentation

## 2021-09-13 DIAGNOSIS — F419 Anxiety disorder, unspecified: Secondary | ICD-10-CM | POA: Diagnosis not present

## 2021-09-13 DIAGNOSIS — L259 Unspecified contact dermatitis, unspecified cause: Secondary | ICD-10-CM | POA: Insufficient documentation

## 2021-09-13 DIAGNOSIS — I2510 Atherosclerotic heart disease of native coronary artery without angina pectoris: Secondary | ICD-10-CM

## 2021-09-13 MED ORDER — CITALOPRAM HYDROBROMIDE 20 MG PO TABS: 20.0000 mg | ORAL_TABLET | Freq: Every day | ORAL | 3 refills | Status: DC

## 2021-09-13 MED ORDER — LORAZEPAM 1 MG PO TABS
0.5000 mg | ORAL_TABLET | Freq: Two times a day (BID) | ORAL | 2 refills | Status: DC | PRN
Start: 2021-09-13 — End: 2022-06-14

## 2021-09-13 MED ORDER — ROSUVASTATIN CALCIUM 5 MG PO TABS: 5.0000 mg | ORAL_TABLET | Freq: Every day | ORAL | 3 refills | Status: DC

## 2021-09-13 MED ORDER — PREDNISONE 20 MG PO TABS: ORAL_TABLET | ORAL | 0 refills | Status: DC

## 2021-09-13 NOTE — Progress Notes (Signed)
Subjective:  Patient ID: Megan Garner, female    DOB: [birth year]  Age: 61 y.o. MRN: [id number]  CC: Annual Exam (Would like refill of lorazepam )   HPI Megan Garner presents for a well exam Megan Garner is complaining of a new poison ivy rash  Outpatient Medications Prior to Visit  Medication Sig Dispense Refill   Cholecalciferol (VITAMIN D3) 50 MCG (2000 UT) capsule Take 1 capsule (2,000 Units total) by mouth daily. 100 capsule 3   esomeprazole (NEXIUM) 20 MG capsule Take 20 mg by mouth daily at 12 noon.     nitrofurantoin, macrocrystal-monohydrate, (MACROBID) 100 MG capsule Take 1 capsule (100 mg total) by mouth 2 (two) times daily. 10 capsule 0   Omega-3 Fatty Acids (FISH OIL) 1000 MG CAPS Take 1 each by mouth daily.       valACYclovir (VALTREX) 1000 MG tablet TAKE 1 BY MOUTH TWICE A DAY X 2 DAYS AS NEEDED 30 tablet 0   citalopram (CELEXA) 20 MG tablet Take 1 tablet (20 mg total) by mouth daily. Annual appt is due w/labs must see provider for future refills 90 tablet 0   LORazepam (ATIVAN) 1 MG tablet Take 0.5-1 tablets (0.5-1 mg total) by mouth 2 (two) times daily as needed for anxiety or sleep. 60 tablet 2   rosuvastatin (CRESTOR) 5 MG tablet Take 1 tablet (5 mg total) by mouth daily. 90 tablet 3   omeprazole (PRILOSEC) 40 MG capsule Take 1 capsule by mouth every morning before breakfast (Patient not taking: Reported on 09/13/2021)     No facility-administered medications prior to visit.    ROS: Review of Systems  Constitutional:  Negative for activity change, appetite change, chills, fatigue and unexpected weight change.  HENT:  Negative for congestion, mouth sores and sinus pressure.   Eyes:  Negative for visual disturbance.  Respiratory:  Negative for cough and chest tightness.   Gastrointestinal:  Negative for abdominal pain and nausea.  Genitourinary:  Negative for difficulty urinating, frequency and vaginal pain.  Musculoskeletal:  Negative for back pain and gait problem.  Skin:   Negative for pallor and rash.  Neurological:  Negative for dizziness, tremors, weakness, numbness and headaches.  Psychiatric/Behavioral:  Negative for confusion and sleep disturbance.     Objective:  BP 114/76 (BP Location: Left Arm, Patient Position: Sitting, Cuff Size: Large)   Pulse 64   Temp 97.6 F (36.4 C) (Temporal)   Ht 5\' 9"  (1.753 m)   Wt 169 lb (76.7 kg)   LMP 03/24/2011   SpO2 98%   BMI 24.96 kg/m   BP Readings from Last 3 Encounters:  09/13/21 114/76  04/04/21 126/76  12/07/20 112/70    Wt Readings from Last 3 Encounters:  09/13/21 169 lb (76.7 kg)  04/04/21 168 lb (76.2 kg)  12/07/20 171 lb 9.6 oz (77.8 kg)    Physical Exam Constitutional:      General: She is not in acute distress.    Appearance: She is well-developed.  HENT:     Head: Normocephalic.     Right Ear: External ear normal.     Left Ear: External ear normal.     Nose: Nose normal.  Eyes:     General:        Right eye: No discharge.        Left eye: No discharge.     Conjunctiva/sclera: Conjunctivae normal.     Pupils: Pupils are equal, round, and reactive to light.  Neck:     Thyroid:  No thyromegaly.     Vascular: No JVD.     Trachea: No tracheal deviation.  Cardiovascular:     Rate and Rhythm: Normal rate and regular rhythm.     Heart sounds: Normal heart sounds.  Pulmonary:     Effort: No respiratory distress.     Breath sounds: No stridor. No wheezing.  Abdominal:     General: Bowel sounds are normal. There is no distension.     Palpations: Abdomen is soft. There is no mass.     Tenderness: There is no abdominal tenderness. There is no guarding or rebound.  Musculoskeletal:        General: No tenderness.     Cervical back: Normal range of motion and neck supple. No rigidity.  Lymphadenopathy:     Cervical: No cervical adenopathy.  Skin:    Findings: No erythema or rash.  Neurological:     Cranial Nerves: No cranial nerve deficit.     Motor: No abnormal muscle tone.      Coordination: Coordination normal.     Deep Tendon Reflexes: Reflexes normal.  Psychiatric:        Behavior: Behavior normal.        Thought Content: Thought content normal.        Judgment: Judgment normal.   Contact dermatitis rash on the forearm  Lab Results  Component Value Date   WBC 4.0 07/13/2021   HGB 13.7 07/13/2021   HCT 41.0 07/13/2021   PLT 189.0 07/13/2021   GLUCOSE 99 07/13/2021   GLUCOSE 99 07/13/2021   CHOL 162 07/13/2021   TRIG 77.0 07/13/2021   HDL 67.00 07/13/2021   LDLCALC 80 07/13/2021   ALT 31 07/13/2021   AST 27 07/13/2021   NA 138 07/13/2021   NA 138 07/13/2021   K 4.3 07/13/2021   K 4.3 07/13/2021   CL 101 07/13/2021   CL 101 07/13/2021   CREATININE 0.68 07/13/2021   CREATININE 0.68 07/13/2021   BUN 18 07/13/2021   BUN 18 07/13/2021   CO2 29 07/13/2021   CO2 29 07/13/2021   TSH 2.30 07/13/2021    No results found.  Assessment & Plan:   Problem List Items Addressed This Visit     Allergic rhinitis    Claritin prn      Anxiety disorder    Lexapro Xanax prn  Potential benefits of a long term benzodiazepines  use as well as potential risks  and complications were explained to the patient and were aknowledged.      Relevant Medications   citalopram (CELEXA) 20 MG tablet   LORazepam (ATIVAN) 1 MG tablet   Atherosclerosis of aorta (HCC)    On Crestor, diet and exercise      Relevant Medications   rosuvastatin (CRESTOR) 5 MG tablet   Contact dermatitis    Prednisone prn      Coronary atherosclerosis    On Crestor, diet and exercise      Relevant Medications   rosuvastatin (CRESTOR) 5 MG tablet   Well adult exam - Primary     We discussed age appropriate health related issues, including available/recomended screening tests and vaccinations. We discussed a need for adhering to healthy diet and exercise. Labs/EKG were reviewed/ordered. All questions were answered. LMP 12/14 Colon - Dr Kinnie Scales 2020 GYN/PAP q 12  mo Shingrix Cor calcium CT  2022 -- Coronary calcium score of 35.4.       Relevant Orders   TSH   Urinalysis   CBC  with Differential/Platelet   Lipid panel   Comprehensive metabolic panel   Other Visit Diagnoses     Need for Tdap vaccination       Relevant Orders   Tdap vaccine greater than or equal to 7yo IM (Completed)         Meds ordered this encounter  Medications   citalopram (CELEXA) 20 MG tablet    Sig: Take 1 tablet (20 mg total) by mouth daily. Annual appt is due w/labs must see provider for future refills    Dispense:  90 tablet    Refill:  3   LORazepam (ATIVAN) 1 MG tablet    Sig: Take 0.5-1 tablets (0.5-1 mg total) by mouth 2 (two) times daily as needed for anxiety or sleep.    Dispense:  60 tablet    Refill:  2   rosuvastatin (CRESTOR) 5 MG tablet    Sig: Take 1 tablet (5 mg total) by mouth daily.    Dispense:  90 tablet    Refill:  3   predniSONE (DELTASONE) 20 MG tablet    Sig: 1 po qd po prn as directed    Dispense:  30 tablet    Refill:  0      Follow-up: Return in about 1 year (around 09/14/2022) for Wellness Exam.  Sonda Primes, MD

## 2021-09-13 NOTE — Assessment & Plan Note (Signed)
Lexapro Xanax prn  Potential benefits of a long term benzodiazepines  use as well as potential risks  and complications were explained to the patient and were aknowledged. 

## 2021-09-13 NOTE — Assessment & Plan Note (Signed)
Claritin prn 

## 2021-09-13 NOTE — Assessment & Plan Note (Signed)
Prednisone prn 

## 2021-09-14 DIAGNOSIS — M545 Low back pain, unspecified: Secondary | ICD-10-CM | POA: Diagnosis not present

## 2021-09-14 DIAGNOSIS — M9903 Segmental and somatic dysfunction of lumbar region: Secondary | ICD-10-CM | POA: Diagnosis not present

## 2021-09-14 DIAGNOSIS — M5137 Other intervertebral disc degeneration, lumbosacral region: Secondary | ICD-10-CM | POA: Diagnosis not present

## 2021-09-20 NOTE — Assessment & Plan Note (Signed)
  We discussed age appropriate health related issues, including available/recomended screening tests and vaccinations. We discussed a need for adhering to healthy diet and exercise. Labs/EKG were reviewed/ordered. All questions were answered. LMP 12/14 Colon - Dr Kinnie Scales 2020 GYN/PAP q 12 mo Shingrix Cor calcium CT  2022 -- Coronary calcium score of 35.4.

## 2021-09-20 NOTE — Assessment & Plan Note (Signed)
On Crestor, diet and exercise

## 2021-09-20 NOTE — Assessment & Plan Note (Signed)
On Crestor, diet and exercise 

## 2021-09-27 ENCOUNTER — Other Ambulatory Visit: Payer: Self-pay | Admitting: Internal Medicine

## 2021-10-10 ENCOUNTER — Other Ambulatory Visit: Payer: Self-pay | Admitting: Internal Medicine

## 2021-10-14 DIAGNOSIS — M5137 Other intervertebral disc degeneration, lumbosacral region: Secondary | ICD-10-CM | POA: Diagnosis not present

## 2021-10-14 DIAGNOSIS — M545 Low back pain, unspecified: Secondary | ICD-10-CM | POA: Diagnosis not present

## 2021-10-14 DIAGNOSIS — M9903 Segmental and somatic dysfunction of lumbar region: Secondary | ICD-10-CM | POA: Diagnosis not present

## 2021-11-01 DIAGNOSIS — K3189 Other diseases of stomach and duodenum: Secondary | ICD-10-CM | POA: Diagnosis not present

## 2021-11-02 DIAGNOSIS — M5137 Other intervertebral disc degeneration, lumbosacral region: Secondary | ICD-10-CM | POA: Diagnosis not present

## 2021-11-02 DIAGNOSIS — M545 Low back pain, unspecified: Secondary | ICD-10-CM | POA: Diagnosis not present

## 2021-11-02 DIAGNOSIS — M9903 Segmental and somatic dysfunction of lumbar region: Secondary | ICD-10-CM | POA: Diagnosis not present

## 2021-11-10 ENCOUNTER — Encounter: Payer: Self-pay | Admitting: Internal Medicine

## 2021-11-10 ENCOUNTER — Other Ambulatory Visit: Payer: Self-pay | Admitting: *Deleted

## 2021-11-10 LAB — UPPER ENDOSCOPY

## 2021-11-19 ENCOUNTER — Encounter: Payer: Self-pay | Admitting: Internal Medicine

## 2021-11-30 DIAGNOSIS — M545 Low back pain, unspecified: Secondary | ICD-10-CM | POA: Diagnosis not present

## 2021-11-30 DIAGNOSIS — M9903 Segmental and somatic dysfunction of lumbar region: Secondary | ICD-10-CM | POA: Diagnosis not present

## 2021-11-30 DIAGNOSIS — M5137 Other intervertebral disc degeneration, lumbosacral region: Secondary | ICD-10-CM | POA: Diagnosis not present

## 2021-12-23 DIAGNOSIS — M5137 Other intervertebral disc degeneration, lumbosacral region: Secondary | ICD-10-CM | POA: Diagnosis not present

## 2021-12-23 DIAGNOSIS — M545 Low back pain, unspecified: Secondary | ICD-10-CM | POA: Diagnosis not present

## 2021-12-23 DIAGNOSIS — M9903 Segmental and somatic dysfunction of lumbar region: Secondary | ICD-10-CM | POA: Diagnosis not present

## 2022-01-20 DIAGNOSIS — M5137 Other intervertebral disc degeneration, lumbosacral region: Secondary | ICD-10-CM | POA: Diagnosis not present

## 2022-01-20 DIAGNOSIS — M9903 Segmental and somatic dysfunction of lumbar region: Secondary | ICD-10-CM | POA: Diagnosis not present

## 2022-01-20 DIAGNOSIS — M545 Low back pain, unspecified: Secondary | ICD-10-CM | POA: Diagnosis not present

## 2022-02-05 ENCOUNTER — Telehealth: Payer: BC Managed Care – PPO

## 2022-02-05 DIAGNOSIS — Z6824 Body mass index (BMI) 24.0-24.9, adult: Secondary | ICD-10-CM | POA: Diagnosis not present

## 2022-02-05 DIAGNOSIS — U071 COVID-19: Secondary | ICD-10-CM | POA: Diagnosis not present

## 2022-02-19 ENCOUNTER — Encounter: Payer: Self-pay | Admitting: Internal Medicine

## 2022-02-21 MED ORDER — VALACYCLOVIR HCL 1 G PO TABS: ORAL_TABLET | ORAL | 0 refills | Status: DC

## 2022-02-24 DIAGNOSIS — M5137 Other intervertebral disc degeneration, lumbosacral region: Secondary | ICD-10-CM | POA: Diagnosis not present

## 2022-02-24 DIAGNOSIS — M9903 Segmental and somatic dysfunction of lumbar region: Secondary | ICD-10-CM | POA: Diagnosis not present

## 2022-02-24 DIAGNOSIS — M545 Low back pain, unspecified: Secondary | ICD-10-CM | POA: Diagnosis not present

## 2022-03-01 ENCOUNTER — Other Ambulatory Visit: Payer: Self-pay | Admitting: Internal Medicine

## 2022-03-15 DIAGNOSIS — M25559 Pain in unspecified hip: Secondary | ICD-10-CM | POA: Diagnosis not present

## 2022-03-24 DIAGNOSIS — M25559 Pain in unspecified hip: Secondary | ICD-10-CM | POA: Diagnosis not present

## 2022-03-24 DIAGNOSIS — M5137 Other intervertebral disc degeneration, lumbosacral region: Secondary | ICD-10-CM | POA: Diagnosis not present

## 2022-03-24 DIAGNOSIS — M545 Low back pain, unspecified: Secondary | ICD-10-CM | POA: Diagnosis not present

## 2022-03-24 DIAGNOSIS — M9903 Segmental and somatic dysfunction of lumbar region: Secondary | ICD-10-CM | POA: Diagnosis not present

## 2022-03-31 DIAGNOSIS — M25559 Pain in unspecified hip: Secondary | ICD-10-CM | POA: Diagnosis not present

## 2022-04-08 DIAGNOSIS — M25559 Pain in unspecified hip: Secondary | ICD-10-CM | POA: Diagnosis not present

## 2022-04-15 DIAGNOSIS — M25559 Pain in unspecified hip: Secondary | ICD-10-CM | POA: Diagnosis not present

## 2022-04-16 DIAGNOSIS — M25559 Pain in unspecified hip: Secondary | ICD-10-CM | POA: Diagnosis not present

## 2022-04-21 DIAGNOSIS — M25559 Pain in unspecified hip: Secondary | ICD-10-CM | POA: Diagnosis not present

## 2022-04-28 DIAGNOSIS — M545 Low back pain, unspecified: Secondary | ICD-10-CM | POA: Diagnosis not present

## 2022-04-28 DIAGNOSIS — M9903 Segmental and somatic dysfunction of lumbar region: Secondary | ICD-10-CM | POA: Diagnosis not present

## 2022-04-28 DIAGNOSIS — M5137 Other intervertebral disc degeneration, lumbosacral region: Secondary | ICD-10-CM | POA: Diagnosis not present

## 2022-04-28 DIAGNOSIS — M25559 Pain in unspecified hip: Secondary | ICD-10-CM | POA: Diagnosis not present

## 2022-05-12 DIAGNOSIS — M25559 Pain in unspecified hip: Secondary | ICD-10-CM | POA: Diagnosis not present

## 2022-05-24 DIAGNOSIS — Z01419 Encounter for gynecological examination (general) (routine) without abnormal findings: Secondary | ICD-10-CM | POA: Diagnosis not present

## 2022-05-24 DIAGNOSIS — R87810 Cervical high risk human papillomavirus (HPV) DNA test positive: Secondary | ICD-10-CM | POA: Diagnosis not present

## 2022-05-24 DIAGNOSIS — Z130 Encounter for screening for diseases of the blood and blood-forming organs and certain disorders involving the immune mechanism: Secondary | ICD-10-CM | POA: Diagnosis not present

## 2022-05-24 DIAGNOSIS — Z780 Asymptomatic menopausal state: Secondary | ICD-10-CM | POA: Diagnosis not present

## 2022-05-24 DIAGNOSIS — Z1231 Encounter for screening mammogram for malignant neoplasm of breast: Secondary | ICD-10-CM | POA: Diagnosis not present

## 2022-05-26 DIAGNOSIS — M25559 Pain in unspecified hip: Secondary | ICD-10-CM | POA: Diagnosis not present

## 2022-06-09 DIAGNOSIS — M25559 Pain in unspecified hip: Secondary | ICD-10-CM | POA: Diagnosis not present

## 2022-06-13 ENCOUNTER — Other Ambulatory Visit: Payer: Self-pay | Admitting: Internal Medicine

## 2022-07-07 DIAGNOSIS — M9903 Segmental and somatic dysfunction of lumbar region: Secondary | ICD-10-CM | POA: Diagnosis not present

## 2022-07-07 DIAGNOSIS — M5137 Other intervertebral disc degeneration, lumbosacral region: Secondary | ICD-10-CM | POA: Diagnosis not present

## 2022-07-07 DIAGNOSIS — M545 Low back pain, unspecified: Secondary | ICD-10-CM | POA: Diagnosis not present

## 2022-08-02 DIAGNOSIS — M9903 Segmental and somatic dysfunction of lumbar region: Secondary | ICD-10-CM | POA: Diagnosis not present

## 2022-08-02 DIAGNOSIS — M545 Low back pain, unspecified: Secondary | ICD-10-CM | POA: Diagnosis not present

## 2022-08-02 DIAGNOSIS — M5137 Other intervertebral disc degeneration, lumbosacral region: Secondary | ICD-10-CM | POA: Diagnosis not present

## 2022-08-30 ENCOUNTER — Encounter: Payer: Self-pay | Admitting: Internal Medicine

## 2022-08-31 ENCOUNTER — Other Ambulatory Visit (INDEPENDENT_AMBULATORY_CARE_PROVIDER_SITE_OTHER): Payer: BC Managed Care – PPO

## 2022-08-31 DIAGNOSIS — Z0000 Encounter for general adult medical examination without abnormal findings: Secondary | ICD-10-CM | POA: Diagnosis not present

## 2022-08-31 LAB — URINALYSIS, ROUTINE W REFLEX MICROSCOPIC
Bilirubin Urine: NEGATIVE
Hgb urine dipstick: NEGATIVE
Ketones, ur: NEGATIVE
Nitrite: NEGATIVE
Specific Gravity, Urine: 1.015 (ref 1.000–1.030)
Urine Glucose: NEGATIVE
Urobilinogen, UA: 0.2 (ref 0.0–1.0)
pH: 8.5 — AB (ref 5.0–8.0)

## 2022-08-31 LAB — COMPREHENSIVE METABOLIC PANEL
ALT: 20 U/L (ref 0–35)
AST: 22 U/L (ref 0–37)
Albumin: 4.4 g/dL (ref 3.5–5.2)
Alkaline Phosphatase: 76 U/L (ref 39–117)
BUN: 16 mg/dL (ref 6–23)
CO2: 27 mEq/L (ref 19–32)
Calcium: 9.8 mg/dL (ref 8.4–10.5)
Chloride: 100 mEq/L (ref 96–112)
Creatinine, Ser: 0.67 mg/dL (ref 0.40–1.20)
GFR: 92.6 mL/min (ref >60.00)
Glucose, Bld: 111 mg/dL — ABNORMAL HIGH (ref 70–99)
Potassium: 4.1 mEq/L (ref 3.5–5.1)
Sodium: 136 mEq/L (ref 135–145)
Total Bilirubin: 0.7 mg/dL (ref 0.2–1.2)
Total Protein: 7.4 g/dL (ref 6.0–8.3)

## 2022-08-31 LAB — LIPID PANEL
Cholesterol: 156 mg/dL (ref 0–200)
HDL: 60.8 mg/dL (ref >39.00)
LDL Cholesterol: 79 mg/dL (ref 0–99)
NonHDL: 94.8
Total CHOL/HDL Ratio: 3
Triglycerides: 79 mg/dL (ref 0.0–149.0)
VLDL: 15.8 mg/dL (ref 0.0–40.0)

## 2022-08-31 LAB — CBC WITH DIFFERENTIAL/PLATELET
Basophils Absolute: 0 10*3/uL (ref 0.0–0.1)
Basophils Relative: 0.5 % (ref 0.0–3.0)
Eosinophils Absolute: 0.1 10*3/uL (ref 0.0–0.7)
Eosinophils Relative: 1.9 % (ref 0.0–5.0)
HCT: 41.7 % (ref 36.0–46.0)
Hemoglobin: 13.7 g/dL (ref 12.0–15.0)
Lymphocytes Relative: 34.8 % (ref 12.0–46.0)
Lymphs Abs: 1.9 10*3/uL (ref 0.7–4.0)
MCHC: 32.9 g/dL (ref 30.0–36.0)
MCV: 92.1 fl (ref 78.0–100.0)
Monocytes Absolute: 0.4 10*3/uL (ref 0.1–1.0)
Monocytes Relative: 7.4 % (ref 3.0–12.0)
Neutro Abs: 3 10*3/uL (ref 1.4–7.7)
Neutrophils Relative %: 55.4 % (ref 43.0–77.0)
Platelets: 190 10*3/uL (ref 150.0–400.0)
RBC: 4.53 Mil/uL (ref 3.87–5.11)
RDW: 13.2 % (ref 11.5–15.5)
WBC: 5.4 10*3/uL (ref 4.0–10.5)

## 2022-08-31 LAB — TSH: TSH: 2.06 u[IU]/mL (ref 0.35–5.50)

## 2022-09-01 ENCOUNTER — Encounter: Payer: Self-pay | Admitting: Internal Medicine

## 2022-09-01 DIAGNOSIS — M5137 Other intervertebral disc degeneration, lumbosacral region: Secondary | ICD-10-CM | POA: Diagnosis not present

## 2022-09-01 DIAGNOSIS — M545 Low back pain, unspecified: Secondary | ICD-10-CM | POA: Diagnosis not present

## 2022-09-01 DIAGNOSIS — M9903 Segmental and somatic dysfunction of lumbar region: Secondary | ICD-10-CM | POA: Diagnosis not present

## 2022-09-02 ENCOUNTER — Telehealth: Payer: BC Managed Care – PPO | Admitting: Family Medicine

## 2022-09-02 DIAGNOSIS — F419 Anxiety disorder, unspecified: Secondary | ICD-10-CM | POA: Diagnosis not present

## 2022-09-02 DIAGNOSIS — N3000 Acute cystitis without hematuria: Secondary | ICD-10-CM | POA: Diagnosis not present

## 2022-09-02 MED ORDER — CEPHALEXIN 500 MG PO CAPS: 500.0000 mg | ORAL_CAPSULE | Freq: Two times a day (BID) | ORAL | 0 refills | Status: AC

## 2022-09-02 NOTE — Progress Notes (Signed)
Virtual Visit Consent   Megan Garner, you are scheduled for a virtual visit with a [facility] provider today. Just as with appointments in the office, your consent must be obtained to participate. Your consent will be active for this visit and any virtual visit you may have with one of our providers in the next 365 days. If you have a MyChart account, a copy of this consent can be sent to you electronically.  As this is a virtual visit, video technology does not allow for your provider to perform a traditional examination. This may limit your provider's ability to fully assess your condition. If your provider identifies any concerns that need to be evaluated in person or the need to arrange testing (such as labs, EKG, etc.), we will make arrangements to do so. Although advances in technology are sophisticated, we cannot ensure that it will always work on either your end or our end. If the connection with a video visit is poor, the visit may have to be switched to a telephone visit. With either a video or telephone visit, we are not always able to ensure that we have a secure connection.  By engaging in this virtual visit, you consent to the provision of healthcare and authorize for your insurance to be billed (if applicable) for the services provided during this visit. Depending on your insurance coverage, you may receive a charge related to this service.  I need to obtain your verbal consent now. Are you willing to proceed with your visit today? Megan Garner has provided verbal consent on 09/02/2022 for a virtual visit (video or telephone). Megan Curio, FNP  Date: 09/02/2022 9:04 AM  Virtual Visit via Video Note   I, Megan Garner, connected with  Megan Garner  ([id number], 1960/11/29) on 09/02/22 at  9:00 AM EDT by a video-enabled telemedicine application and verified that I am speaking with the correct person using two identifiers.  Location: Patient: Virtual Visit Location Patient: Home Provider:  Virtual Visit Location Provider: Home Office   I discussed the limitations of evaluation and management by telemedicine and the availability of in person appointments. The patient expressed understanding and agreed to proceed.    History of Present Illness: Megan Garner is a 62 y.o. who identifies as a female who was assigned female at birth, and is being seen today for pressure and frequency with urination with positive UA. Her anxiety has worsened also probably due to infection. No fever. Marland Kitchen  HPI: HPI  Problems:  Patient Active Problem List   Diagnosis Date Noted   Atrophy of vagina 09/13/2021   Postmenopausal state 09/13/2021   Contact dermatitis 09/13/2021   Mixed hyperlipidemia 05/23/2021   Vertigo 12/07/2020   Atherosclerosis of aorta (HCC) 08/06/2020   Coronary atherosclerosis 08/06/2020   Family history of colon cancer in father 03/18/2018   History of melanoma 03/18/2018   Grief 02/24/2017   Anxiety disorder 01/06/2017   Well adult exam 08/27/2013   Warts 04/24/2011   UPPER RESPIRATORY INFECTION (URI) 02/13/2007   Allergic rhinitis 02/13/2007   Asthma 02/13/2007   Melanoma in situ (HCC) 08/08/1983    Allergies: No Known Allergies Medications:  Current Outpatient Medications:    Cholecalciferol (VITAMIN D3) 50 MCG (2000 UT) capsule, Take 1 capsule (2,000 Units total) by mouth daily., Disp: 100 capsule, Rfl: 3   citalopram (CELEXA) 20 MG tablet, TAKE 1 TABLET BY MOUTH DAILY, Disp: 90 tablet, Rfl: 3   esomeprazole (NEXIUM) 20 MG capsule, Take 20 mg by mouth daily  at 12 noon., Disp: , Rfl:    LORazepam (ATIVAN) 1 MG tablet, TAKE 1/2 TO 1 TABLET BY MOUTH TWICE A DAY AS NEEDED FOR ANXIETY/SLEEP, Disp: 60 tablet, Rfl: 2   nitrofurantoin, macrocrystal-monohydrate, (MACROBID) 100 MG capsule, Take 1 capsule (100 mg total) by mouth 2 (two) times daily., Disp: 10 capsule, Rfl: 0   Omega-3 Fatty Acids (FISH OIL) 1000 MG CAPS, Take 1 each by mouth daily.  , Disp: , Rfl:    predniSONE  (DELTASONE) 20 MG tablet, TAKE 1 TABLET EVERY DAY AS NEEDED, Disp: 30 tablet, Rfl: 0   rosuvastatin (CRESTOR) 5 MG tablet, TAKE 1 TABLET BY MOUTH DAILY, Disp: 90 tablet, Rfl: 3   valACYclovir (VALTREX) 1000 MG tablet, Take 1 by mouth twice a day as needed, Disp: 30 tablet, Rfl: 0  Observations/Objective: Patient is well-developed, well-nourished in no acute distress.  Resting comfortably  at home.  Head is normocephalic, atraumatic.  No labored breathing.  Speech is clear and coherent with logical content.  Patient is alert and oriented at baseline.    Assessment and Plan: 1. Acute cystitis without hematuria  2. Anxiety disorder, unspecified type  UTI preventtive measures discussed, uc if sx persist or worsen. Continue anxiety meds.   Follow Up Instructions: I discussed the assessment and treatment plan with the patient. The patient was provided an opportunity to ask questions and all were answered. The patient agreed with the plan and demonstrated an understanding of the instructions.  A copy of instructions were sent to the patient via MyChart unless otherwise noted below.     The patient was advised to call back or seek an in-person evaluation if the symptoms worsen or if the condition fails to improve as anticipated.  Time:  I spent 10 minutes with the patient via telehealth technology discussing the above problems/concerns.    Megan Curio, FNP

## 2022-09-02 NOTE — Patient Instructions (Addendum)
Generalized Anxiety Disorder, Adult Generalized anxiety disorder (GAD) is a mental health condition. Unlike normal worries, anxiety related to GAD is not triggered by a specific event. These worries do not fade or get better with time. GAD interferes with relationships, work, and school. GAD symptoms can vary from mild to severe. People with severe GAD can have intense waves of anxiety with physical symptoms that are similar to panic attacks. What are the causes? The exact cause of GAD is not known, but the following are believed to have an impact: Differences in natural brain chemicals. Genes passed down from parents to children. Differences in the way threats are perceived. Development and stress during childhood. Personality. What increases the risk? The following factors may make you more likely to develop this condition: Being female. Having a family history of anxiety disorders. Being very shy. Experiencing very stressful life events, such as the death of a loved one. Having a very stressful family environment. What are the signs or symptoms? People with GAD often worry excessively about many things in their lives, such as their health and family. Symptoms may also include: Mental and emotional symptoms: Worrying excessively about natural disasters. Fear of being late. Difficulty concentrating. Fears that others are judging your performance. Physical symptoms: Fatigue. Headaches, muscle tension, muscle twitches, trembling, or feeling shaky. Feeling like your heart is pounding or beating very fast. Feeling out of breath or like you cannot take a deep breath. Having trouble falling asleep or staying asleep, or experiencing restlessness. Sweating. Nausea, diarrhea, or irritable bowel syndrome (IBS). Behavioral symptoms: Experiencing erratic moods or irritability. Avoidance of new situations. Avoidance of people. Extreme difficulty making decisions. How is this diagnosed? This  condition is diagnosed based on your symptoms and medical history. You will also have a physical exam. Your health care provider may perform tests to rule out other possible causes of your symptoms. To be diagnosed with GAD, a person must have anxiety that: Is out of his or her control. Affects several different aspects of his or her life, such as work and relationships. Causes distress that makes him or her unable to take part in normal activities. Includes at least three symptoms of GAD, such as restlessness, fatigue, trouble concentrating, irritability, muscle tension, or sleep problems. Before your health care provider can confirm a diagnosis of GAD, these symptoms must be present more days than they are not, and they must last for 6 months or longer. How is this treated? This condition may be treated with: Medicine. Antidepressant medicine is usually prescribed for long-term daily control. Anti-anxiety medicines may be added in severe cases, especially when panic attacks occur. Talk therapy (psychotherapy). Certain types of talk therapy can be helpful in treating GAD by providing support, education, and guidance. Options include: Cognitive behavioral therapy (CBT). People learn coping skills and self-calming techniques to ease their physical symptoms. They learn to identify unrealistic thoughts and behaviors and to replace them with more appropriate thoughts and behaviors. Acceptance and commitment therapy (ACT). This treatment teaches people how to be mindful as a way to cope with unwanted thoughts and feelings. Biofeedback. This process trains you to manage your body's response (physiological response) through breathing techniques and relaxation methods. You will work with a therapist while machines are used to monitor your physical symptoms. Stress management techniques. These include yoga, meditation, and exercise. A mental health specialist can help determine which treatment is best for you.  Some people see improvement with one type of therapy. However, other people require  a combination of therapies. Follow these instructions at home: Lifestyle Maintain a consistent routine and schedule. Anticipate stressful situations. Create a plan and allow extra time to work with your plan. Practice stress management or self-calming techniques that you have learned from your therapist or your health care provider. Exercise regularly and spend time outdoors. Eat a healthy diet that includes plenty of vegetables, fruits, whole grains, low-fat dairy products, and lean protein. Do not eat a lot of foods that are high in fat, added sugar, or salt (sodium). Drink plenty of water. Avoid alcohol. Alcohol can increase anxiety. Avoid caffeine and certain over-the-counter cold medicines. These may make you feel worse. Ask your pharmacist which medicines to avoid. General instructions Take over-the-counter and prescription medicines only as told by your health care provider. Understand that you are likely to have setbacks. Accept this and be kind to yourself as you persist to take better care of yourself. Anticipate stressful situations. Create a plan and allow extra time to work with your plan. Recognize and accept your accomplishments, even if you judge them as small. Spend time with people who care about you. Keep all follow-up visits. This is important. Where to find more information [employer]: http://www.maynard.net/ Substance Abuse and Mental Health Services: [employer].pt Contact a health care provider if: Your symptoms do not get better. Your symptoms get worse. You have signs of depression, such as: A persistently sad or irritable mood. Loss of enjoyment in activities that used to bring you joy. Change in weight or eating. Changes in sleeping habits. Get help right away if: You have thoughts about hurting yourself or others. If you ever feel like you may hurt  yourself or others, or have thoughts about taking your own life, get help right away. Go to your nearest emergency department or: Call your local emergency services (911 in the [place] Call a suicide crisis helpline, such as the National Suicide Prevention Lifeline at (203)501-2899 or 988 in the [place] This is open 24 hours a day in the [place] Text the Crisis Text Line at 914 764 5375 (in the [place] Summary Generalized anxiety disorder (GAD) is a mental health condition that involves worry that is not triggered by a specific event. People with GAD often worry excessively about many things in their lives, such as their health and family. GAD may cause symptoms such as restlessness, trouble concentrating, sleep problems, frequent sweating, nausea, diarrhea, headaches, and trembling or muscle twitching. A mental health specialist can help determine which treatment is best for you. Some people see improvement with one type of therapy. However, other people require a combination of therapies. This information is not intended to replace advice given to you by your health care provider. Make sure you discuss any questions you have with your health care provider. Document Revised: 08/19/2020 Document Reviewed: 05/17/2020 Elsevier Patient Education  2024 [employer] Urinary Tract Infection, Adult  A urinary tract infection (UTI) is an infection of any part of the urinary tract. The urinary tract includes the kidneys, ureters, bladder, and urethra. These organs make, store, and get rid of urine in the body. An upper UTI affects the ureters and kidneys. A lower UTI affects the bladder and urethra. What are the causes? Most urinary tract infections are caused by bacteria in your genital area around your urethra, where urine leaves your body. These bacteria grow and cause inflammation of your urinary tract. What increases the risk? You are more likely to develop this condition if: You have a  urinary catheter that  stays in place. You are not able to control when you urinate or have a bowel movement (incontinence). You are female and you: Use a spermicide or diaphragm for birth control. Have low estrogen levels. Are pregnant. You have certain genes that increase your risk. You are sexually active. You take antibiotic medicines. You have a condition that causes your flow of urine to slow down, such as: An enlarged prostate, if you are female. Blockage in your urethra. A kidney stone. A nerve condition that affects your bladder control (neurogenic bladder). Not getting enough to drink, or not urinating often. You have certain medical conditions, such as: Diabetes. A weak disease-fighting system (immunesystem). Sickle cell disease. Gout. Spinal cord injury. What are the signs or symptoms? Symptoms of this condition include: Needing to urinate right away (urgency). Frequent urination. This may include small amounts of urine each time you urinate. Pain or burning with urination. Blood in the urine. Urine that smells bad or unusual. Trouble urinating. Cloudy urine. Vaginal discharge, if you are female. Pain in the abdomen or the lower back. You may also have: Vomiting or a decreased appetite. Confusion. Irritability or tiredness. A fever or chills. Diarrhea. The first symptom in older adults may be confusion. In some cases, they may not have any symptoms until the infection has worsened. How is this diagnosed? This condition is diagnosed based on your medical history and a physical exam. You may also have other tests, including: Urine tests. Blood tests. Tests for STIs (sexually transmitted infections). If you have had more than one UTI, a cystoscopy or imaging studies may be done to determine the cause of the infections. How is this treated? Treatment for this condition includes: Antibiotic medicine. Over-the-counter medicines to treat discomfort. Drinking enough water to stay  hydrated. If you have frequent infections or have other conditions such as a kidney stone, you may need to see a health care provider who specializes in the urinary tract (urologist). In rare cases, urinary tract infections can cause sepsis. Sepsis is a life-threatening condition that occurs when the body responds to an infection. Sepsis is treated in the hospital with IV antibiotics, fluids, and other medicines. Follow these instructions at home:  Medicines Take over-the-counter and prescription medicines only as told by your health care provider. If you were prescribed an antibiotic medicine, take it as told by your health care provider. Do not stop using the antibiotic even if you start to feel better. General instructions Make sure you: Empty your bladder often and completely. Do not hold urine for long periods of time. Empty your bladder after sex. Wipe from front to back after urinating or having a bowel movement if you are female. Use each tissue only one time when you wipe. Drink enough fluid to keep your urine pale yellow. Keep all follow-up visits. This is important. Contact a health care provider if: Your symptoms do not get better after 1-2 days. Your symptoms go away and then return. Get help right away if: You have severe pain in your back or your lower abdomen. You have a fever or chills. You have nausea or vomiting. Summary A urinary tract infection (UTI) is an infection of any part of the urinary tract, which includes the kidneys, ureters, bladder, and urethra. Most urinary tract infections are caused by bacteria in your genital area. Treatment for this condition often includes antibiotic medicines. If you were prescribed an antibiotic medicine, take it as told by your health care provider. Do  not stop using the antibiotic even if you start to feel better. Keep all follow-up visits. This is important. This information is not intended to replace advice given to you by your  health care provider. Make sure you discuss any questions you have with your health care provider. Document Revised: 09/01/2019 Document Reviewed: 09/06/2019 Elsevier Patient Education  2024 [employer].

## 2022-09-05 DIAGNOSIS — F419 Anxiety disorder, unspecified: Secondary | ICD-10-CM | POA: Diagnosis not present

## 2022-09-07 ENCOUNTER — Encounter (INDEPENDENT_AMBULATORY_CARE_PROVIDER_SITE_OTHER): Payer: Self-pay

## 2022-09-19 DIAGNOSIS — F419 Anxiety disorder, unspecified: Secondary | ICD-10-CM | POA: Diagnosis not present

## 2022-09-27 ENCOUNTER — Encounter: Payer: Self-pay | Admitting: Internal Medicine

## 2022-09-27 ENCOUNTER — Ambulatory Visit (INDEPENDENT_AMBULATORY_CARE_PROVIDER_SITE_OTHER): Payer: BC Managed Care – PPO | Admitting: Internal Medicine

## 2022-09-27 VITALS — BP 120/66 | HR 67 | Temp 98.6°F | Ht 69.0 in | Wt 176.0 lb

## 2022-09-27 DIAGNOSIS — Z0000 Encounter for general adult medical examination without abnormal findings: Secondary | ICD-10-CM | POA: Diagnosis not present

## 2022-09-27 DIAGNOSIS — I2510 Atherosclerotic heart disease of native coronary artery without angina pectoris: Secondary | ICD-10-CM

## 2022-09-27 DIAGNOSIS — F419 Anxiety disorder, unspecified: Secondary | ICD-10-CM

## 2022-09-27 DIAGNOSIS — R5383 Other fatigue: Secondary | ICD-10-CM

## 2022-09-27 DIAGNOSIS — E049 Nontoxic goiter, unspecified: Secondary | ICD-10-CM | POA: Diagnosis not present

## 2022-09-27 DIAGNOSIS — I2583 Coronary atherosclerosis due to lipid rich plaque: Secondary | ICD-10-CM

## 2022-09-27 MED ORDER — ESCITALOPRAM OXALATE 10 MG PO TABS: 10.0000 mg | ORAL_TABLET | Freq: Every day | ORAL | 1 refills | Status: DC

## 2022-09-27 NOTE — Assessment & Plan Note (Signed)
  We discussed age appropriate health related issues, including available/recomended screening tests and vaccinations. We discussed a need for adhering to healthy diet and exercise. Labs/EKG were reviewed/ordered. All questions were answered. LMP 12/14 Colon - Dr Kinnie Scales 2020 GYN/PAP q 12 mo Shingrix Cor calcium CT  2022 -- Coronary calcium score of 35.4.

## 2022-09-27 NOTE — Assessment & Plan Note (Signed)
[facility] ref considered

## 2022-09-27 NOTE — Progress Notes (Signed)
Subjective:  Patient ID: Megan Garner, female    DOB: [birth year]  Age: 62 y.o. MRN: [id number]  CC: Annual Exam (Discuss anxiety )   HPI Megan Garner presents for a well exam Megan Garner is complaining of anxiety.  She is not depressed.  No panic attacks  Outpatient Medications Prior to Visit  Medication Sig Dispense Refill   Cholecalciferol (VITAMIN D3) 50 MCG (2000 UT) capsule Take 1 capsule (2,000 Units total) by mouth daily. 100 capsule 3   esomeprazole (NEXIUM) 20 MG capsule Take 20 mg by mouth daily at 12 noon.     LORazepam (ATIVAN) 1 MG tablet TAKE 1/2 TO 1 TABLET BY MOUTH TWICE A DAY AS NEEDED FOR ANXIETY/SLEEP 60 tablet 2   Omega-3 Fatty Acids (FISH OIL) 1000 MG CAPS Take 1 each by mouth daily.       predniSONE (DELTASONE) 20 MG tablet TAKE 1 TABLET EVERY DAY AS NEEDED 30 tablet 0   rosuvastatin (CRESTOR) 5 MG tablet TAKE 1 TABLET BY MOUTH DAILY 90 tablet 3   valACYclovir (VALTREX) 1000 MG tablet Take 1 by mouth twice a day as needed 30 tablet 0   citalopram (CELEXA) 20 MG tablet TAKE 1 TABLET BY MOUTH DAILY 90 tablet 3   nitrofurantoin, macrocrystal-monohydrate, (MACROBID) 100 MG capsule Take 1 capsule (100 mg total) by mouth 2 (two) times daily. 10 capsule 0   No facility-administered medications prior to visit.    ROS: Review of Systems  Constitutional:  Negative for activity change, appetite change, chills, fatigue and unexpected weight change.  HENT:  Negative for congestion, mouth sores and sinus pressure.   Eyes:  Negative for visual disturbance.  Respiratory:  Negative for cough and chest tightness.   Gastrointestinal:  Negative for abdominal pain and nausea.  Genitourinary:  Negative for difficulty urinating, frequency and vaginal pain.  Musculoskeletal:  Negative for back pain and gait problem.  Skin:  Negative for pallor and rash.  Neurological:  Negative for dizziness, tremors, weakness, numbness and headaches.  Psychiatric/Behavioral:  Negative for confusion, sleep  disturbance and suicidal ideas.     Objective:  BP 120/66 (BP Location: Left Arm, Patient Position: Sitting, Cuff Size: Large)   Pulse 67   Temp 98.6 F (37 C) (Oral)   Ht 5\' 9"  (1.753 m)   Wt 176 lb (79.8 kg)   LMP 03/24/2011   SpO2 99%   BMI 25.99 kg/m   BP Readings from Last 3 Encounters:  09/27/22 120/66  09/13/21 114/76  04/04/21 126/76    Wt Readings from Last 3 Encounters:  09/27/22 176 lb (79.8 kg)  09/13/21 169 lb (76.7 kg)  04/04/21 168 lb (76.2 kg)    Physical Exam Constitutional:      General: She is not in acute distress.    Appearance: Normal appearance. She is well-developed.  HENT:     Head: Normocephalic.     Right Ear: External ear normal.     Left Ear: External ear normal.     Nose: Nose normal.  Eyes:     General:        Right eye: No discharge.        Left eye: No discharge.     Conjunctiva/sclera: Conjunctivae normal.     Pupils: Pupils are equal, round, and reactive to light.  Neck:     Thyroid: No thyromegaly.     Vascular: No JVD.     Trachea: No tracheal deviation.  Cardiovascular:     Rate and Rhythm: Normal  rate and regular rhythm.     Heart sounds: Normal heart sounds.  Pulmonary:     Effort: No respiratory distress.     Breath sounds: No stridor. No wheezing.  Abdominal:     General: Bowel sounds are normal. There is no distension.     Palpations: Abdomen is soft. There is no mass.     Tenderness: There is no abdominal tenderness. There is no guarding or rebound.  Musculoskeletal:        General: No tenderness.     Cervical back: Normal range of motion and neck supple. No rigidity.  Lymphadenopathy:     Cervical: No cervical adenopathy.  Skin:    Findings: No erythema or rash.  Neurological:     Cranial Nerves: No cranial nerve deficit.     Motor: No abnormal muscle tone.     Coordination: Coordination normal.     Deep Tendon Reflexes: Reflexes normal.  Psychiatric:        Behavior: Behavior normal.        Thought  Content: Thought content normal.        Judgment: Judgment normal.   goiter  Lab Results  Component Value Date   WBC 5.4 08/31/2022   HGB 13.7 08/31/2022   HCT 41.7 08/31/2022   PLT 190.0 08/31/2022   GLUCOSE 111 (H) 08/31/2022   CHOL 156 08/31/2022   TRIG 79.0 08/31/2022   HDL 60.80 08/31/2022   LDLCALC 79 08/31/2022   ALT 20 08/31/2022   AST 22 08/31/2022   NA 136 08/31/2022   K 4.1 08/31/2022   CL 100 08/31/2022   CREATININE 0.67 08/31/2022   BUN 16 08/31/2022   CO2 27 08/31/2022   TSH 2.06 08/31/2022    No results found.  Assessment & Plan:   Problem List Items Addressed This Visit     Well adult exam - Primary    We discussed age appropriate health related issues, including available/recomended screening tests and vaccinations. We discussed a need for adhering to healthy diet and exercise. Labs/EKG were reviewed/ordered. All questions were answered. LMP 12/14 Colon - Dr Kinnie Scales 2020 GYN/PAP q 12 mo Shingrix Cor calcium CT  2022 -- Coronary calcium score of 35.4.       Anxiety disorder    D/c Citalopram Start Lexapro Lorazepam prn  Potential benefits of a long term benzodiazepines  use as well as potential risks  and complications were explained to the patient and were aknowledged. Check sleep quality with Apple watch.  Record snoring at night      Relevant Medications   escitalopram (LEXAPRO) 10 MG tablet   Other Relevant Orders   Ambulatory referral to Psychology   Coronary atherosclerosis    Crestor      Goiter    Check thyroid US      Relevant Orders   US THYROID   Fatigue    [facility] ref considered         Meds ordered this encounter  Medications   escitalopram (LEXAPRO) 10 MG tablet    Sig: Take 1 tablet (10 mg total) by mouth daily.    Dispense:  90 tablet    Refill:  1      Follow-up: Return in about 3 months (around 12/28/2022) for a follow-up visit.  Sonda Primes, MD

## 2022-09-27 NOTE — Assessment & Plan Note (Addendum)
D/c Citalopram Start Lexapro Lorazepam prn  Potential benefits of a long term benzodiazepines  use as well as potential risks  and complications were explained to the patient and were aknowledged. Check sleep quality with Apple watch.  Record snoring at night

## 2022-09-27 NOTE — Assessment & Plan Note (Signed)
Check thyroid US. 

## 2022-09-27 NOTE — Assessment & Plan Note (Signed)
-   Crestor 

## 2022-09-29 ENCOUNTER — Other Ambulatory Visit: Payer: Self-pay | Admitting: Medical Genetics

## 2022-09-29 DIAGNOSIS — Z006 Encounter for examination for normal comparison and control in clinical research program: Secondary | ICD-10-CM

## 2022-10-04 ENCOUNTER — Ambulatory Visit
Admission: RE | Admit: 2022-10-04 | Discharge: 2022-10-04 | Disposition: A | Discharge status: Home / Self Care | Payer: BC Managed Care – PPO | Source: Ambulatory Visit | Attending: Internal Medicine | Admitting: Internal Medicine

## 2022-10-04 DIAGNOSIS — E041 Nontoxic single thyroid nodule: Secondary | ICD-10-CM | POA: Diagnosis not present

## 2022-10-11 DIAGNOSIS — M545 Low back pain, unspecified: Secondary | ICD-10-CM | POA: Diagnosis not present

## 2022-10-11 DIAGNOSIS — M9903 Segmental and somatic dysfunction of lumbar region: Secondary | ICD-10-CM | POA: Diagnosis not present

## 2022-10-11 DIAGNOSIS — M5137 Other intervertebral disc degeneration, lumbosacral region: Secondary | ICD-10-CM | POA: Diagnosis not present

## 2022-10-12 ENCOUNTER — Ambulatory Visit (INDEPENDENT_AMBULATORY_CARE_PROVIDER_SITE_OTHER): Payer: BC Managed Care – PPO | Admitting: Psychology

## 2022-10-12 DIAGNOSIS — F419 Anxiety disorder, unspecified: Secondary | ICD-10-CM

## 2022-10-12 DIAGNOSIS — F32A Depression, unspecified: Secondary | ICD-10-CM

## 2022-10-12 NOTE — Progress Notes (Signed)
? ? ? ? ? ? ? ? ? ? ? ? ? ? ?  YATES,LEANNE, LCMHC ?

## 2022-10-12 NOTE — Progress Notes (Signed)
[facility] Counselor Initial Adult Exam  Name: Megan Garner Date: 10/12/2022 MRN: [id number] DOB: 01/29/1961 PCP: Tresa Garter, MD  Time spent: 1:28pm-2:30pm  pt is seen for a virtual video visit via caregility.  Pt joins from her home, reporting privacy, and counselor from her home office.  Pt consents to virtual visit and is aware of limitations of such visits.    Guardian/Payee:  self    Paperwork requested: No   Reason for Visit /Presenting Problem: Pt is referred by her PCP for anxiety.  Pt reports that she has "always struggled w/ a sense of wellbeing" and was on Celexa for about 10 years which had been helpful.  Pt reports that the "past couple of months have been awful", feeling easily overwhelmed and on edge w/ things that usually not a big deal for her.  Pt didn't want to get out of bed some days, feeling of needing to get away from all stress.   Pt reports over past several years has dealt w/ significant life changes and aware this is impacting her.  Her husband was dx w/ dementia in 2017 but was managing w/ meds and still very independent.  Pt reports on Christmas day 2018 he experienced a massive brain bleed and fell down a flight of stairs and died 4 hours later at the hospital.  Pt reports this was while visiting her family for Christmas in [place].  Pt reports that in 2019 she sold their larger home to downsize and moved into new home just prior to shut down of pandemic.  In [place] and bought condo.  Pt tried a dating couch in for 3 months in 2022 and found to be too intense experience.  Pt reports that she has been dating current boyfriend for about 15 months.  Pt reports some stress w/ work, considering retiring in year and needs to transition clients.  Pt reports her boyfriend was stressed couple months ago w/ this job and did cause her stress.  Pt reports that is improved now.       Mental Status Exam: Appearance:    Well Groomed     Behavior:  Appropriate  Motor:  Normal  Speech/Language:   Normal Rate  Affect:  Appropriate  Mood:  anxious and depressed  Thought process:  normal  Thought content:    WNL  Sensory/Perceptual disturbances:    WNL  Orientation:  oriented to person, place, and time/date  Attention:  Good  Concentration:  Good  Memory:  WNL  Fund of knowledge:   Good  Insight:    Good  Judgment:   Good  Impulse Control:  Good    Reported Symptoms:  Pt reports anxiety, feeling on edge and easily overwhelmed.  Pt reports increased irritability.  Pt reports difficulty getting out of bed some days.  Pt reports some loss of interest, pt reports depressed moods.  Pt reports some improvement w/ change to Lexapro.  Pt reports in past exercise has always helped to get over stress and anxiety and not finding that to be the case anymore.  Pt denies SI.  Pt reports taking Ativan 4x a week currently- prior to increased anxiety only 3 a month.    Risk Assessment: Danger to Self:  No Self-injurious Behavior: No Danger to Others: No Duty to Warn:no Physical Aggression / Violence:No  Access to Firearms a concern: No  Gang Involvement:No  Patient / guardian was educated about steps  to take if suicide or homicide risk level increases between visits: yes While future psychiatric events cannot be accurately predicted, the patient does not currently require acute inpatient psychiatric care and does not currently meet [facility] involuntary commitment criteria.  Substance Abuse History: Current substance abuse: No     Past Psychiatric History:   Previous psychological history is significant for anxiety and depression Outpatient Providers:Pt reports 3-4 counseling sessions previous following husband's death.  History of Psych Hospitalization: No  Psychological Testing:  none    Abuse History:  Victim of: No.,  none    Report needed: No. Victim of Neglect:No. Perpetrator of  none   Witness /  Exposure to Domestic Violence: No   Protective Services Involvement: No  Witness to [employer] Violence:  No   Family History:  Family History  Problem Relation Age of Onset   Hypertension Mother    Arthritis Mother        gout   Heart disease Mother 59       STENT   Cancer Father 59       colon ca   Hypertension Other   Pt grew up in [place] w/ her mom, dad and 2 brothers.  Pt reports that she is close w/ her siblings and mom.  She speaks w/ her mom weekly.  Pt reports her mom has heart disease and overweight.  Her dad died when she was in her 4s due to colon cancer.  Pt reported she was very close to her father so his loss was difficult.   Pt usually visits once a year to [place] were her family lives. She moved w/ her husband to [place] in the 90s after he retired from police department.   Living situation: the patient lives alone with her dog  Sexual Orientation: Straight  Relationship Status: widowed 5 years ago-  married 35 years to husband who was 20 years older.   Pt has a step son who is married and granddaughter who is 18y/o and attending college.  Pt reports since husband's death relationship strained.  She still keeps in contact w/step son through email mostly.   Pt dating her boyfriend for 15months. He is also a widow.  He lives out of town so they spend weekends together.   Support Systems: friends boyfriend  One big connection is their trade groups.    Financial Stress:  No   Income/Employment/Disability: Employment  Firefighter for 25 years. Pt is hoping to retire in the next year and hopes to travel more, increase time w/ friends, volunteer work.     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   Stressors: Traumatic event   Other: major life changes/loss in past 5 years.      Strengths: Supportive Relationships, Friends, and Self Advocate  Barriers:  none   Legal History: Pending legal issue / charges: The patient has no significant history of legal issues. History of legal issue / charges:  none  Medical History/Surgical History: reviewed Past Medical History:  Diagnosis Date   Allergy    cyclical cough   Asthma    Melanoma in situ [facility])     Past Surgical History:  Procedure Laterality Date   FACIAL COSMETIC SURGERY  Medications: Current Outpatient Medications  Medication Sig Dispense Refill   Cholecalciferol (VITAMIN D3) 50 MCG (2000 UT) capsule Take 1 capsule (2,000 Units total) by mouth daily. 100 capsule 3   escitalopram (LEXAPRO) 10 MG tablet Take 1 tablet (10 mg total) by mouth daily. 90 tablet 1   esomeprazole (NEXIUM) 20 MG capsule Take 20 mg by mouth daily at 12 noon.     LORazepam (ATIVAN) 1 MG tablet TAKE 1/2 TO 1 TABLET BY MOUTH TWICE A DAY AS NEEDED FOR ANXIETY/SLEEP 60 tablet 2   Omega-3 Fatty Acids (FISH OIL) 1000 MG CAPS Take 1 each by mouth daily.       predniSONE (DELTASONE) 20 MG tablet TAKE 1 TABLET EVERY DAY AS NEEDED 30 tablet 0   rosuvastatin (CRESTOR) 5 MG tablet TAKE 1 TABLET BY MOUTH DAILY 90 tablet 3   valACYclovir (VALTREX) 1000 MG tablet Take 1 by mouth twice a day as needed 30 tablet 0   No current facility-administered medications for this visit.    No Known Allergies  Diagnoses:  Anxiety disorder, unspecified type  Depression, unspecified depression type  Plan of Care: Pt is a 62 y/o widow seeking counseling for increased anxiety and depression over past 2-3 months.  Pt reports struggle w/ depressed mood and anxiety previous and OBGYN started on Celexa 10 years ago which benefited.  Pt aware of impact of significant life changes and losses in past 5 years.  Pt is seeking counseling to assist  coping and reduce feeling overwhelmed and down.  Pt changed to lexapro w/ her PCP recently.  Pt to f/u w/ weekly to biweekly counseling and w/ her PCP for medication management.   Support Good friend 6 month before mine.  Much closer.     Individualized Treatment Plan Strengths: enjoys travel, time w/ friends, walking dog, enjoys time at lake.  Pt working out consistently.   Supports: friends, good friend that lost her husband 6 months prior to her.  Pt is close to her family.    Goal/Needs for Treatment:  In order of importance to patient 1) decreased anxiety, irritability and depressed mood.  2) --- 3) ---   Client Statement of Needs: Sense of wellbeing.  Things I can do or not do to gain a sense of wellbeing.     Treatment Level:outpatient counseling  Symptoms:increased anxiety/ on edge, irritability, overwhelmed.  Pt depressed mood  Client Treatment Preferences:weekly to biweekly counseling.     Healthcare consumer's goal for treatment:  Counselor, Megan Garner, [facility] will support the patient's ability to achieve the goals identified. Cognitive Behavioral Therapy, Assertive Communication/Conflict Resolution Training, Relaxation Training, ACT, Humanistic and other evidenced-based practices will be used to promote progress towards healthy functioning.   Healthcare consumer will: Actively participate in therapy, working towards healthy functioning.    *Justification for Continuation/Discontinuation of Goal: R=Revised, O=Ongoing, A=Achieved, D=Discontinued  Goal 1) decrease anxiety and depressive symptoms AEB pt report and therapist observation. Baseline date 10/12/22: Progress towards goal 0; How Often - Daily Target Date Goal Was reviewed Status Code Progress towards goal/Likert rating  10/12/23                Goal 2) Increased use of effective coping skills to manage stress and process grief per pt report of implementing.    Baseline date 10/12/22: Progress towards goal 0; How  Often - Daily Target Date Goal Was reviewed Status Code Progress towards goal  10/12/23  This plan has been reviewed and created by the following participants:  This plan will be reviewed at least every 12 months. Date Behavioral Health Clinician Date Guardian/Patient   10/12/23  [facility] Megan Garner [facility] 11/01/23 Verbal Consent Provided                    Megan Garner [facility]

## 2022-10-19 ENCOUNTER — Ambulatory Visit: Payer: BC Managed Care – PPO | Admitting: Psychology

## 2022-11-03 ENCOUNTER — Ambulatory Visit: Payer: BC Managed Care – PPO | Admitting: Psychology

## 2022-11-03 DIAGNOSIS — F32A Depression, unspecified: Secondary | ICD-10-CM

## 2022-11-03 DIAGNOSIS — F419 Anxiety disorder, unspecified: Secondary | ICD-10-CM | POA: Diagnosis not present

## 2022-11-03 NOTE — Progress Notes (Addendum)
[facility] Counselor/Therapist Progress Note  Patient ID: Megan Garner, MRN: [id number],    Date: 11/03/2022  Time Spent: 1:35pm-2:32pm  Pt is seen for in person visit.   Treatment Type: Individual Therapy  Reported Symptoms: Pt reports feels less overwhelmed since her vacation.  Pt reports awareness that current relationship is playing impact.  Mental Status Exam: Appearance:  Well Groomed     Behavior: Appropriate  Motor: Normal  Speech/Language:  Clear and Coherent  Affect: Appropriate  Mood: anxious  Thought process: normal  Thought content:   WNL  Sensory/Perceptual disturbances:   WNL  Orientation: oriented to person, place, time/date, and situation  Attention: Good  Concentration: Good  Memory: WNL  Fund of knowledge:  Good  Insight:   Good  Judgment:  Good  Impulse Control: Good   Risk Assessment: Danger to Self:  No Self-injurious Behavior: No Danger to Others: No Duty to Warn:no Physical Aggression / Violence:No  Access to Firearms a concern: No  Gang Involvement:No   Subjective: Counselor assessed pt current functioning per pt report.  Processed w/ pt current relationship and concerns.  Discussed how impacting anxiety.  Explored pt wants w/ relationship and setting boundaries consistent with.   Pt affect wnl. Pt reported that she went on vacation and enjoyed her time away from work and visit w/ family and time w/ boyfriend.  Pt discussed insight that current relationship and concerns has may be impacting feeling on edge.  Pt discussed how not sure how relationship will play out long term.  Pt also aware that boyfriend has some issues that may result in feeling that would have to be caregiver in future and doesn't want.  Pt discussed that content w/ having a companion, not looking to co habitat.  Pt discussed how she feels more settle talking through and that can keep boundaries in relationship to this.    Interventions: Cognitive Behavioral Therapy,  Solution-Oriented/Positive Psychology, and Boundaries  Diagnosis:Anxiety disorder, unspecified type  Depression, unspecified depression type  Plan: Pt to f/u w/ biweekly counseling.  Pt to f/u as scheduled w/ PCP.   Individualized Treatment Plan Strengths: enjoys travel, time w/ friends, walking dog, enjoys time at lake.  Pt working out consistently.   Supports: friends, good friend that lost her husband 6 months prior to her.  Pt is close to her family.     Goal/Needs for Treatment:  In order of importance to patient 1) decreased anxiety, irritability and depressed mood.  2) --- 3) ---    Client Statement of Needs: Sense of wellbeing.  Things I can do or not do to gain a sense of wellbeing.      Treatment Level:outpatient counseling  Symptoms:increased anxiety/ on edge, irritability, overwhelmed.  Pt depressed mood  Client Treatment Preferences:weekly to biweekly counseling.      Healthcare consumer's goal for treatment:   Counselor, [name], [facility] will support the patient's ability to achieve the goals identified. Cognitive Behavioral Therapy, Assertive Communication/Conflict Resolution Training, Relaxation Training, ACT, Humanistic and other evidenced-based practices will be used to promote progress towards healthy functioning.    Healthcare consumer will: Actively participate in therapy, working towards healthy functioning.     *Justification for Continuation/Discontinuation of Goal: R=Revised, O=Ongoing, A=Achieved, D=Discontinued   Goal 1) decrease anxiety and depressive symptoms AEB pt report and therapist observation. Baseline date 10/12/22: Progress towards goal 0; How Often - Daily Target Date Goal Was reviewed Status Code Progress towards goal/Likert rating  10/12/23  Goal 2) Increased use of effective coping skills to manage stress and process grief per pt report of implementing.    Baseline date 10/12/22: Progress towards goal 0; How Often  - Daily Target Date Goal Was reviewed Status Code Progress towards goal  10/12/23                            This plan has been reviewed and created by the following participants:  This plan will be reviewed at least every 12 months. Date Behavioral Health Clinician Date Guardian/Patient   10/12/23               [facility] [name] [facility] 11/01/23 Verbal Consent Provided             [name] [facility]

## 2022-11-08 DIAGNOSIS — M9903 Segmental and somatic dysfunction of lumbar region: Secondary | ICD-10-CM | POA: Diagnosis not present

## 2022-11-08 DIAGNOSIS — M545 Low back pain, unspecified: Secondary | ICD-10-CM | POA: Diagnosis not present

## 2022-11-08 DIAGNOSIS — M51372 Other intervertebral disc degeneration, lumbosacral region with discogenic back pain and lower extremity pain: Secondary | ICD-10-CM | POA: Diagnosis not present

## 2022-11-17 ENCOUNTER — Ambulatory Visit: Payer: BC Managed Care – PPO | Admitting: Psychology

## 2022-11-17 DIAGNOSIS — F419 Anxiety disorder, unspecified: Secondary | ICD-10-CM | POA: Diagnosis not present

## 2022-11-17 DIAGNOSIS — F32A Depression, unspecified: Secondary | ICD-10-CM

## 2022-11-17 NOTE — Progress Notes (Addendum)
[facility] Counselor/Therapist Progress Note  Patient ID: Megan Garner, MRN: [id number],    Date: 11/17/2022  Time Spent: 11:05am-11:52pm  Pt is seen for in person visit.   Treatment Type: Individual Therapy  Reported Symptoms: Pt reports feels more settle about relationship.  Pt reports still feels easily irrtiable/on edge.   Mental Status Exam: Appearance:  Well Groomed     Behavior: Appropriate  Motor: Normal  Speech/Language:  Clear and Coherent  Affect: Appropriate  Mood: anxious  Thought process: normal  Thought content:   WNL  Sensory/Perceptual disturbances:   WNL  Orientation: oriented to person, place, time/date, and situation  Attention: Good  Concentration: Good  Memory: WNL  Fund of knowledge:  Good  Insight:   Good  Judgment:  Good  Impulse Control: Good   Risk Assessment: Danger to Self:  No Self-injurious Behavior: No Danger to Others: No Duty to Warn:no Physical Aggression / Violence:No  Access to Firearms a concern: No  Gang Involvement:No   Subjective: Counselor assessed pt current functioning per pt report.  Processed w/ pt moods and explored current coping skills.   Explored things that are benefiting and bringing joy and things that aren't.  Discussed need for practices to assist in settling system/grounding/relaxing.  Assisted pt in identifying activities and how to implement.      Pt affect wnl. Pt reported she does feel more settled w/ knowing she doesn't have to move towards cohabitating in relationship.  Pt reported awareness of still feeling easily on edge/irritable/snappy and not feeling joy.  Pt reported that did yesterday.  Pt identified that was positive to take a walk after work, enjoy a meal.  Pt reported awareness that some w/ on phone- games not benefiting.  Pt reported awareness that difficulty for her to be still.  Pt discussed activities that can help towards- walks, stretching, deleting game apps, practice being present  w/out judging.  Pt also reported her husband was a calming presence and that not having impacts.    Interventions: Cognitive Behavioral Therapy, Solution-Oriented/Positive Psychology, and Boundaries  Diagnosis:Anxiety disorder, unspecified type  Depression, unspecified depression type  Plan: Pt to f/u w/ biweekly counseling.  Pt to f/u as scheduled w/ PCP.    Individualized Treatment Plan Strengths: enjoys travel, time w/ friends, walking dog, enjoys time at lake.  Pt working out consistently.   Supports: friends, good friend that lost her husband 6 months prior to her.  Pt is close to her family.     Goal/Needs for Treatment:  In order of importance to patient 1) decreased anxiety, irritability and depressed mood.  2) --- 3) ---    Client Statement of Needs: Sense of wellbeing.  Things I can do or not do to gain a sense of wellbeing.      Treatment Level:outpatient counseling  Symptoms:increased anxiety/ on edge, irritability, overwhelmed.  Pt depressed mood  Client Treatment Preferences:weekly to biweekly counseling.      Healthcare consumer's goal for treatment:   Counselor, [name], [facility] will support the patient's ability to achieve the goals identified. Cognitive Behavioral Therapy, Assertive Communication/Conflict Resolution Training, Relaxation Training, ACT, Humanistic and other evidenced-based practices will be used to promote progress towards healthy functioning.    Healthcare consumer will: Actively participate in therapy, working towards healthy functioning.     *Justification for Continuation/Discontinuation of Goal: R=Revised, O=Ongoing, A=Achieved, D=Discontinued   Goal 1) decrease anxiety and depressive symptoms AEB pt report and therapist observation. Baseline date 10/12/22: Progress towards goal 0;  How Often - Daily Target Date Goal Was reviewed Status Code Progress towards goal/Likert rating  10/12/23                            Goal 2) Increased use of  effective coping skills to manage stress and process grief per pt report of implementing.    Baseline date 10/12/22: Progress towards goal 0; How Often - Daily Target Date Goal Was reviewed Status Code Progress towards goal  10/12/23                            This plan has been reviewed and created by the following participants:  This plan will be reviewed at least every 12 months. Date Behavioral Health Clinician Date Guardian/Patient   10/12/23               [facility] [name] [facility] 11/01/23 Verbal Consent Provided                             [name] [facility]

## 2022-12-07 DIAGNOSIS — M9903 Segmental and somatic dysfunction of lumbar region: Secondary | ICD-10-CM | POA: Diagnosis not present

## 2022-12-07 DIAGNOSIS — M51372 Other intervertebral disc degeneration, lumbosacral region with discogenic back pain and lower extremity pain: Secondary | ICD-10-CM | POA: Diagnosis not present

## 2022-12-08 ENCOUNTER — Ambulatory Visit: Payer: BC Managed Care – PPO | Admitting: Psychology

## 2022-12-08 DIAGNOSIS — F419 Anxiety disorder, unspecified: Secondary | ICD-10-CM | POA: Diagnosis not present

## 2022-12-08 DIAGNOSIS — F32A Depression, unspecified: Secondary | ICD-10-CM | POA: Diagnosis not present

## 2022-12-08 NOTE — Progress Notes (Signed)
[facility] Counselor/Therapist Progress Note  Patient ID: Megan Garner, MRN: [id number],    Date: 12/08/2022  Time Spent: 3:30pm-4:20pm  Pt is seen for in person visit.   Treatment Type: Individual Therapy  Reported Symptoms: Pt reports reframing expectations for freetime.  Pt reports initiating relaxation response.    Mental Status Exam: Appearance:  Well Groomed     Behavior: Appropriate  Motor: Normal  Speech/Language:  Clear and Coherent  Affect: Appropriate  Mood: anxious  Thought process: normal  Thought content:   WNL  Sensory/Perceptual disturbances:   WNL  Orientation: oriented to person, place, time/date, and situation  Attention: Good  Concentration: Good  Memory: WNL  Fund of knowledge:  Good  Insight:   Good  Judgment:  Good  Impulse Control: Good   Risk Assessment: Danger to Self:  No Self-injurious Behavior: No Danger to Others: No Duty to Warn:no Physical Aggression / Violence:No  Access to Firearms a concern: No  Gang Involvement:No   Subjective: Counselor assessed pt current functioning per pt report.  Processed w/ pt moods and coping skills.  Explored practices to assist in settling system/grounding/relaxing.  Discussed outcomes and how to be intentional about using.  Reflected awareness of shifting to letting go of expectations for self that creating stress.  Led pt through mindful breathing practice.   Pt affect wnl. Pt reported she is feeling less anxious and on edge, but still present at time.  Pt reported that she did a guided body can and felt good/relaxed following and then recognized that hadn't repeated.  Pt discussed ways to be more intentional w/ practicing and planning for.  Pt did report she is reframing expectations for self on weekends and that doesn't have to "go, go, go" and get stuff done.  Pt participated in mindful practice and reported positive experience and relaxed following.  Pt insight that as she begins to shift  towards retirement recognizes that needed step for self and will give opportunity for other personal goals.     Interventions: Cognitive Behavioral Therapy, Solution-Oriented/Positive Psychology, and Boundaries  Diagnosis:Anxiety disorder, unspecified type  Depression, unspecified depression type  Plan: Pt to f/u in 3 weeks w/ counseling.  Pt to f/u as scheduled w/ PCP.    Individualized Treatment Plan Strengths: enjoys travel, time w/ friends, walking dog, [place] time [place].  Pt working out consistently.   Supports: friends, good friend that lost her husband 6 months prior to her.  Pt is close to her family.     Goal/Needs for Treatment:  In order of importance to patient 1) decreased anxiety, irritability and depressed mood.  2) --- 3) ---    Client Statement of Needs: Sense of wellbeing.  Things I can do or not do to gain a sense of wellbeing.      Treatment Level:outpatient counseling  Symptoms:increased anxiety/ on edge, irritability, overwhelmed.  Pt depressed mood  Client Treatment Preferences:weekly to biweekly counseling.      Healthcare consumer's goal for treatment:   Counselor, [name], [facility] will support the patient's ability to achieve the goals identified. Cognitive Behavioral Therapy, Assertive Communication/Conflict Resolution Training, Relaxation Training, ACT, Humanistic and other evidenced-based practices will be used to promote progress towards healthy functioning.    Healthcare consumer will: Actively participate in therapy, working towards healthy functioning.     *Justification for Continuation/Discontinuation of Goal: R=Revised, O=Ongoing, A=Achieved, D=Discontinued   Goal 1) decrease anxiety and depressive symptoms AEB pt report and therapist observation. Baseline date 10/12/22: Progress  towards goal 0; How Often - Daily Target Date Goal Was reviewed Status Code Progress towards goal/Likert rating  10/12/23                            Goal 2)  Increased use of effective coping skills to manage stress and process grief per pt report of implementing.    Baseline date 10/12/22: Progress towards goal 0; How Often - Daily Target Date Goal Was reviewed Status Code Progress towards goal  10/12/23                            This plan has been reviewed and created by the following participants:  This plan will be reviewed at least every 12 months. Date Behavioral Health Clinician Date Guardian/Patient   10/12/23               [facility] [name] [facility] 11/01/23 Verbal Consent Provided                   [name] [facility]

## 2022-12-10 IMAGING — CT CT CARDIAC CORONARY ARTERY CALCIUM SCORE
3 series · 14 of 20 positions shown, 15 images · non-contrast
Comparison: None

Addendum:
CLINICAL DATA: Cardiovascular Disease Risk stratification

EXAM:
Coronary Calcium Score
TECHNIQUE: A gated, non-contrast computed tomography scan of the heart was
performed using 3mm slice thickness. Axial images were analyzed on a
dedicated workstation. Calcium scoring of the coronary arteries was
performed using the Agatston method.

[Series 2: casc 3.0 bv41 2 bestdiast 69 % · axial · 0.45mm/px · z∈[+1236,+1322]mm · 4 of 49 slices shown, 5 images]
[im 10/49  vessel]
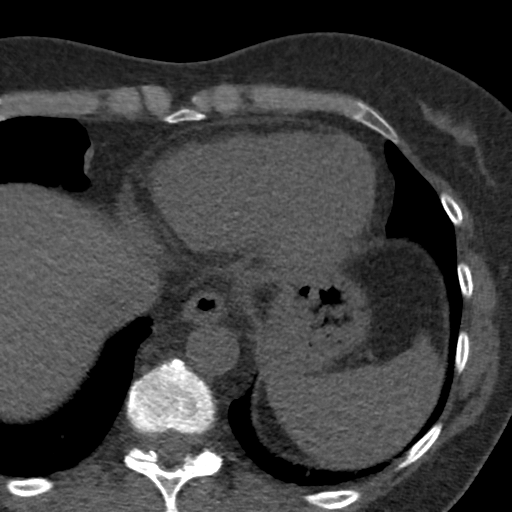
[im 10/49  lung]
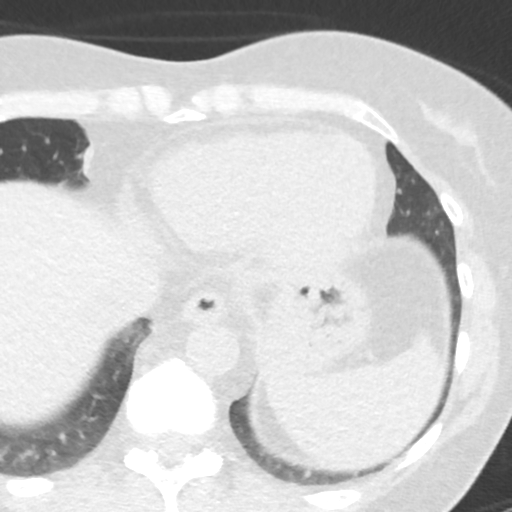
[im 20/49  vessel]
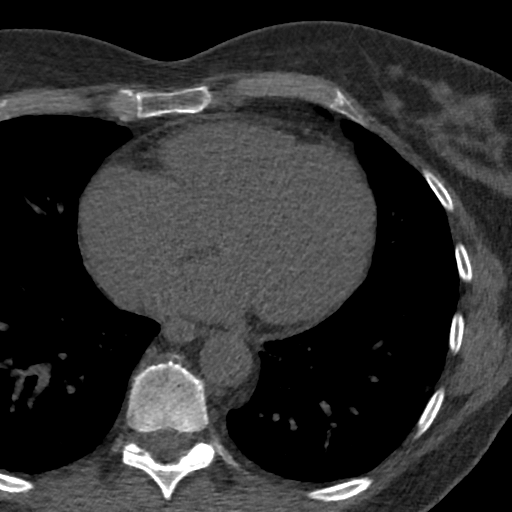
[im 29/49  vessel]
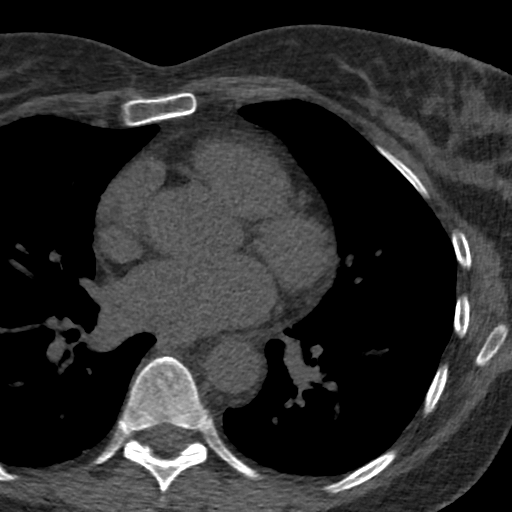
[im 39/49  vessel]
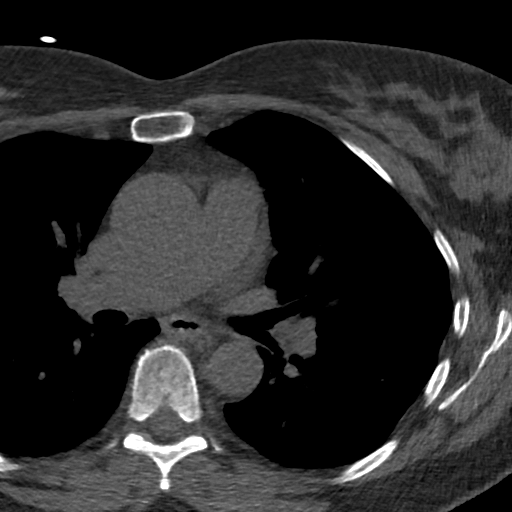

[Series 3: lung 69 % · axial · 0.68mm/px · z∈[+1232,+1328]mm · 5 of 49 slices shown]
[im 9/49  lung]
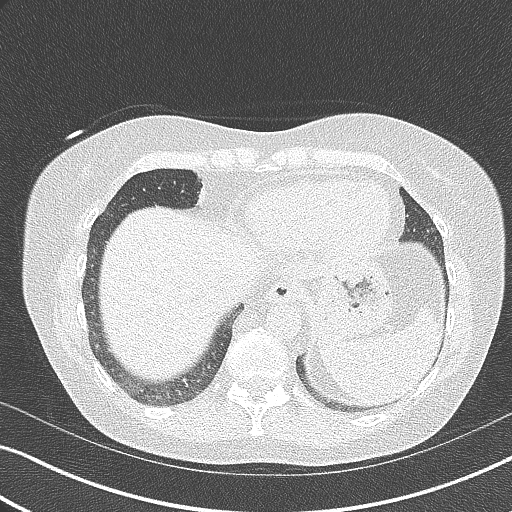
[im 17/49  lung]
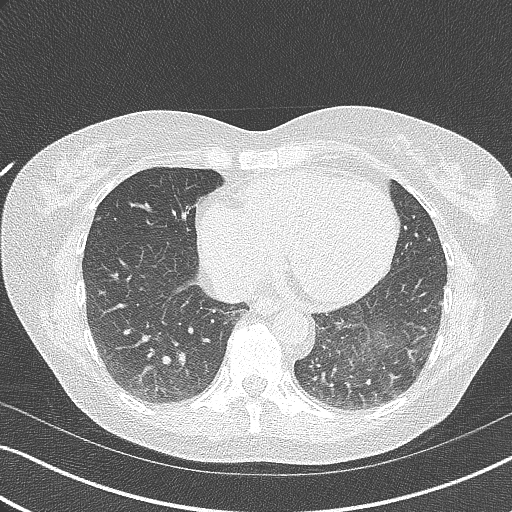
[im 25/49  lung]
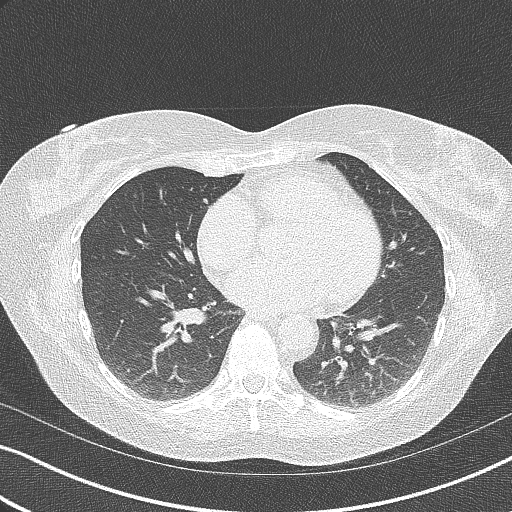
[im 33/49  lung]
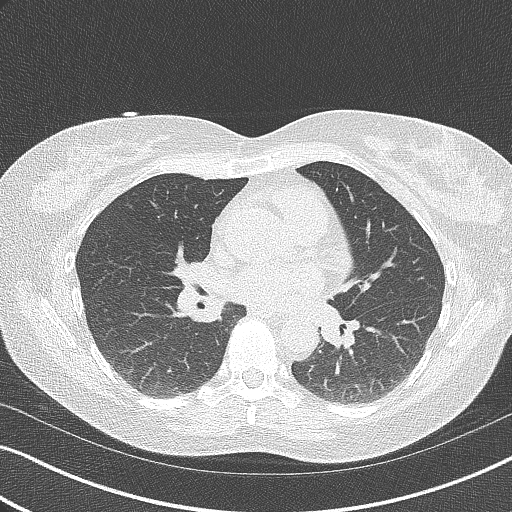
[im 41/49  lung]
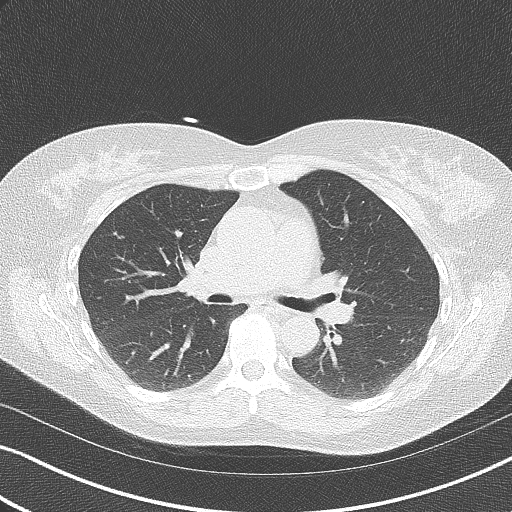

[Series 4: lung st 69 % · axial · 0.68mm/px · z∈[+1232,+1328]mm · 5 of 49 slices shown]
[im 9/49  lung]
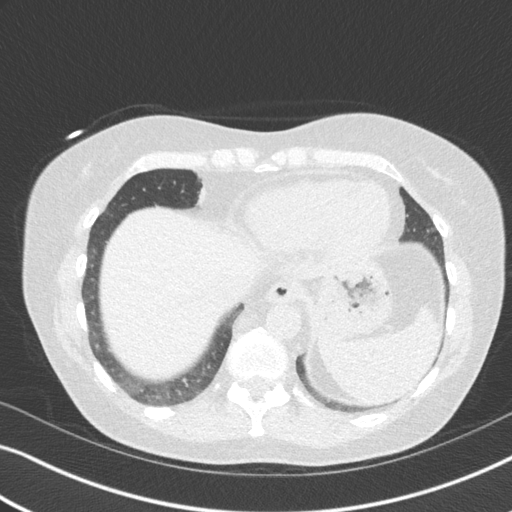
[im 17/49  lung]
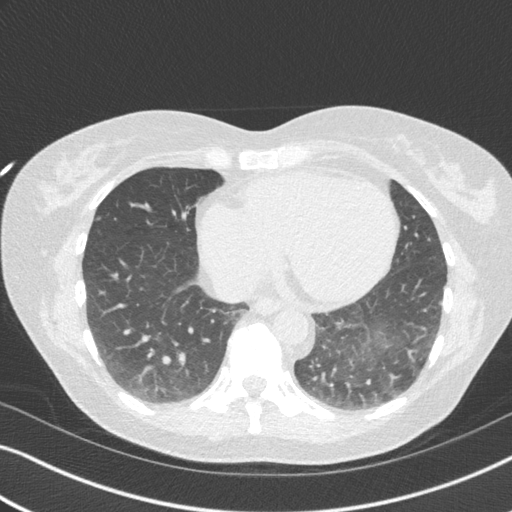
[im 25/49  lung]
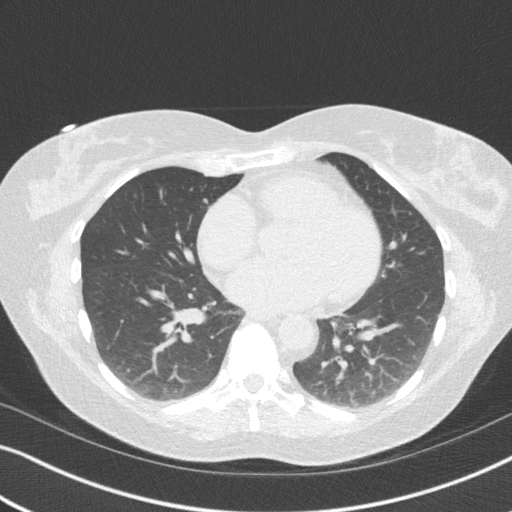
[im 33/49  lung]
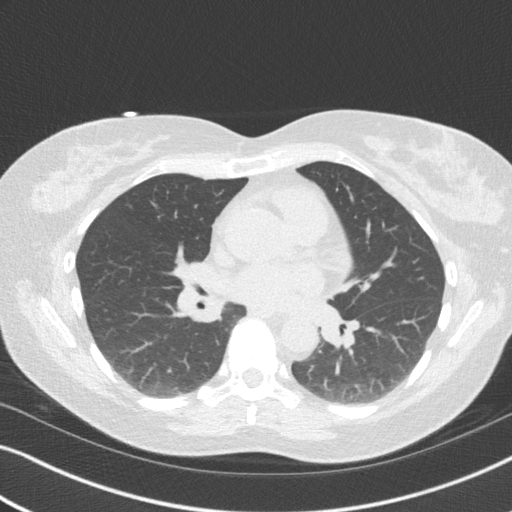
[im 41/49  lung]
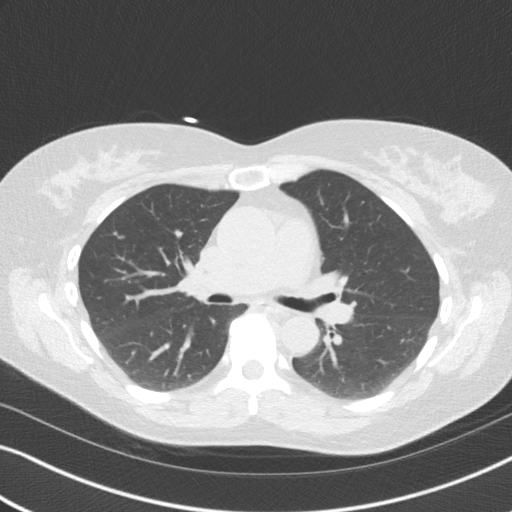

[14 of 20 positions shown; findings below may reference images not displayed]

FINDINGS: Coronary Calcium Score:

Left main: 0

Left anterior descending artery:

Left circumflex artery: 0

Right coronary artery: 0

Total:

Percentile: 79

Pericardium: Normal.

Ascending Aorta: Aortic root mildly dilated (40 mm).

Non-cardiac: See separate report from [REDACTED].
IMPRESSION: Coronary calcium score of 35.4. This was 79 percentile for age-,
race-, and sex-matched controls.

Mildly dilated aortic root (40 mm).



If CAC=0, it is reasonable to withhold statin therapy and reassess
in 5 to 10 years, as long as higher risk conditions are absent
(diabetes mellitus, family history of premature CHD in first degree
relatives (males <55 years; females <65 years), cigarette smoking,
or LDL >=190 mg/dL).

If CAC is 1 to 99, it is reasonable to initiate statin therapy for
patients >=55 years of age.

If CAC is >=100 or >=75th percentile, it is reasonable to initiate
statin therapy at any age.

Cardiology referral should be considered for patients with CAC
scores >=400 or >=75th percentile.

*4913 AHA/ACC/AACVPR/AAPA/ABC/GENESIS/SAFODIEN/NANCY/Deysi Yaneth/TOMSE/KLEBIO/JUMPER
Guideline on the Management of Blood Cholesterol: A Report of the
American College of Cardiology/American Heart Association Task Force
on Clinical Practice Guidelines. J Am Coll Cardiol.
4906;73(24):0410-0621.

EXAM:
OVER-READ INTERPRETATION  CT CHEST

The following report is an over-read performed by radiologist Dr.
over-read does not include interpretation of cardiac or coronary
anatomy or pathology. Chest imaging extends from the mid chest
through the lung bases with limited coverage for coronary calcium
evaluation. The coronary calcium interpretation by the cardiologist
is attached.
FINDINGS: Cardiovascular: See dedicated cardiac read for further detail
regarding cardiac structures. Signs of minimal aortic
atherosclerosis are noted along with calcified coronary artery
disease. No substantial pericardial effusion.

Mediastinum/Nodes: Visualized mediastinal structures without signs
of adenopathy. Limited assessment of the chest as outlined above

Lungs/Pleura: 4 mm pulmonary nodule RIGHT upper lobe (image [DATE])

Small ill-defined RIGHT middle lobe nodules (image 33/3) and image
[DATE] in the lateral RIGHT middle lobe measuring 4-5 mm.

LEFT lower lobe pulmonary nodule (image [DATE]) 5 mm.

No effusion or consolidation in the visualized chest.

Upper Abdomen: Incidental imaging of upper abdominal contents is
unremarkable.

Musculoskeletal: No acute or destructive bone process.
IMPRESSION: No acute findings.

Small pulmonary nodules at 5 mm or less in the LEFT and RIGHT chest.
No follow-up needed if patient is low-risk (and has no known or
suspected primary neoplasm). Non-contrast chest CT can be considered
in 12 months if patient is high-risk. This recommendation follows
the consensus statement: Guidelines for Management of Incidental
Pulmonary Nodules Detected on CT Images: From the [HOSPITAL]

Minimal aortic atherosclerosis.

Aortic Atherosclerosis ([id number].O).

*** End of Addendum ***
FINDINGS: Coronary Calcium Score:

Left main: 0

Left anterior descending artery:

Left circumflex artery: 0

Right coronary artery: 0

Total:

Percentile: 79

Pericardium: Normal.

Ascending Aorta: Aortic root mildly dilated (40 mm).

Non-cardiac: See separate report from [REDACTED].
IMPRESSION: Coronary calcium score of 35.4. This was 79 percentile for age-,
race-, and sex-matched controls.

Mildly dilated aortic root (40 mm).



If CAC=0, it is reasonable to withhold statin therapy and reassess
in 5 to 10 years, as long as higher risk conditions are absent
(diabetes mellitus, family history of premature CHD in first degree
relatives (males <55 years; females <65 years), cigarette smoking,
or LDL >=190 mg/dL).

If CAC is 1 to 99, it is reasonable to initiate statin therapy for
patients >=55 years of age.

If CAC is >=100 or >=75th percentile, it is reasonable to initiate
statin therapy at any age.

Cardiology referral should be considered for patients with CAC
scores >=400 or >=75th percentile.

*4913 AHA/ACC/AACVPR/AAPA/ABC/GENESIS/SAFODIEN/NANCY/Deysi Yaneth/TOMSE/KLEBIO/JUMPER
Guideline on the Management of Blood Cholesterol: A Report of the
American College of Cardiology/American Heart Association Task Force
on Clinical Practice Guidelines. J Am Coll Cardiol.
4906;73(24):0410-0621.

## 2022-12-28 DIAGNOSIS — M9903 Segmental and somatic dysfunction of lumbar region: Secondary | ICD-10-CM | POA: Diagnosis not present

## 2022-12-28 DIAGNOSIS — M51372 Other intervertebral disc degeneration, lumbosacral region with discogenic back pain and lower extremity pain: Secondary | ICD-10-CM | POA: Diagnosis not present

## 2022-12-29 ENCOUNTER — Ambulatory Visit (INDEPENDENT_AMBULATORY_CARE_PROVIDER_SITE_OTHER): Payer: BC Managed Care – PPO | Admitting: Psychology

## 2022-12-29 DIAGNOSIS — F32A Depression, unspecified: Secondary | ICD-10-CM

## 2022-12-29 DIAGNOSIS — F419 Anxiety disorder, unspecified: Secondary | ICD-10-CM

## 2022-12-29 NOTE — Progress Notes (Signed)
Behavioral Health Counselor/Therapist Progress Note  Patient ID: Megan Garner, MRN: [id number],    Date: 12/29/2022  Time Spent: 3:31pm-4:28pm  Pt is seen for in person visit.   Treatment Type: Individual Therapy  Reported Symptoms: Pt reports recognizing need for boundaries in current relationship and increased time for self/friends  Mental Status Exam: Appearance:  Well Groomed     Behavior: Appropriate  Motor: Normal  Speech/Language:  Clear and Coherent  Affect: Appropriate  Mood: anxious  Thought process: normal  Thought content:   WNL  Sensory/Perceptual disturbances:   WNL  Orientation: oriented to person, place, time/date, and situation  Attention: Good  Concentration: Good  Memory: WNL  Fund of knowledge:  Good  Insight:   Good  Judgment:  Good  Impulse Control: Good   Risk Assessment: Danger to Self:  No Self-injurious Behavior: No Danger to Others: No Duty to Warn:no Physical Aggression / Violence:No  Access to Firearms a concern: No  Gang Involvement:No   Subjective: Counselor assessed pt current functioning per pt report.  Processed w/ pt recent stressors and anxiety.  Reflected insight into relationship and boundaries she is needing to have balance for self.   Discussed ways she is already asserting and next steps for self to have more time for self outside of work and w/ friends.  Pt affect wnl. Pt reported has done a lot of reflecting this week as had interactions w/ boyfriend that felt that she was giving more than receiving.  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  Pt recognized that between work and time w/ boyfriend she doesn't have time for self and for friends.  Pt identified her values and how this is important to her.  Pt identified some steps to make progress towards.   Interventions: Cognitive Behavioral Therapy, Solution-Oriented/Positive Psychology, and Boundaries  Diagnosis:Anxiety disorder,  unspecified type  Depression, unspecified depression type  Plan: Pt to f/u in 3 weeks w/ counseling.  Pt to f/u as scheduled w/ PCP.    Individualized Treatment Plan Strengths: enjoys travel, time w/ friends, walking dog, [place] time [place].  Pt working out consistently.   Supports: friends, good friend that lost her husband 6 months prior to her.  Pt is close to her family.     Goal/Needs for Treatment:  In order of importance to patient 1) decreased anxiety, irritability and depressed mood.  2) --- 3) ---    Client Statement of Needs: Sense of wellbeing.  Things I can do or not do to gain a sense of wellbeing.      Treatment Level:outpatient counseling  Symptoms:increased anxiety/ on edge, irritability, overwhelmed.  Pt depressed mood  Client Treatment Preferences:weekly to biweekly counseling.      Healthcare consumer's goal for treatment:   Counselor, [name], [facility] will support the patient's ability to achieve the goals identified. Cognitive Behavioral Therapy, Assertive Communication/Conflict Resolution Training, Relaxation Training, ACT, Humanistic and other evidenced-based practices will be used to promote progress towards healthy functioning.    Healthcare consumer will: Actively participate in therapy, working towards healthy functioning.     *Justification for Continuation/Discontinuation of Goal: R=Revised, O=Ongoing, A=Achieved, D=Discontinued   Goal 1) decrease anxiety and depressive symptoms AEB pt report and therapist observation. Baseline date 10/12/22: Progress towards goal 0; How Often - Daily Target Date Goal Was reviewed Status Code Progress towards goal/Likert rating  10/12/23  Goal 2) Increased use of effective coping skills to manage stress and process grief per pt report of implementing.    Baseline date 10/12/22: Progress towards goal 0; How Often - Daily Target Date Goal Was reviewed Status Code Progress towards goal  10/12/23                             This plan has been reviewed and created by the following participants:  This plan will be reviewed at least every 12 months. Date Behavioral Health Clinician Date Guardian/Patient   10/12/23               [facility] [name] [facility] 11/01/23 Verbal Consent Provided                     [name] [facility]

## 2023-01-19 ENCOUNTER — Encounter: Payer: Self-pay | Admitting: Internal Medicine

## 2023-01-19 ENCOUNTER — Ambulatory Visit: Payer: BC Managed Care – PPO | Admitting: Psychology

## 2023-01-19 ENCOUNTER — Ambulatory Visit: Payer: BC Managed Care – PPO | Admitting: Internal Medicine

## 2023-01-19 VITALS — BP 133/84 | HR 111 | Temp 98.3°F | Ht 69.0 in | Wt 174.0 lb

## 2023-01-19 DIAGNOSIS — F419 Anxiety disorder, unspecified: Secondary | ICD-10-CM | POA: Diagnosis not present

## 2023-01-19 DIAGNOSIS — F32A Depression, unspecified: Secondary | ICD-10-CM

## 2023-01-19 MED ORDER — ARIPIPRAZOLE 2 MG PO TABS: 2.0000 mg | ORAL_TABLET | Freq: Every day | ORAL | 2 refills | Status: DC

## 2023-01-19 MED ORDER — LORAZEPAM 1 MG PO TABS: ORAL_TABLET | ORAL | 2 refills | Status: DC

## 2023-01-19 MED ORDER — ESCITALOPRAM OXALATE 20 MG PO TABS: 20.0000 mg | ORAL_TABLET | Freq: Every day | ORAL | 1 refills | Status: DC

## 2023-01-19 NOTE — Assessment & Plan Note (Addendum)
Worse: severe anxiety. 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 Very anxious, tearful in the daytime. Able to sleep well. Psychology f/u  appt is this afternoon. Her family is in [place]. Dating now. Taking Lorazepam <0.5 mg/d... Increase Lexapro to 20 mg/d OK to take  more Lorazepam prn as on Rx Abilify low dose if not better in 2-3 d  Potential benefits of a long term benzodiazepines  use as well as potential risks  and complications were explained to the patient and were aknowledged. See Leanne more often

## 2023-01-19 NOTE — Progress Notes (Signed)
[place] [facility] Counselor/Therapist Progress Note  Patient ID: Megan Garner, MRN: [id number],    Date: 01/19/2023  Time Spent: 3:30pm-4:25pm  Pt is seen for in person visit.   Treatment Type: Individual Therapy  Reported Symptoms: Pt reports increased anxiety and feeling on edge over past week.   Mental Status Exam: Appearance:  Well Groomed     Behavior: Appropriate  Motor: Normal  Speech/Language:  Clear and Coherent  Affect: Appropriate  Mood: anxious  Thought process: normal  Thought content:   WNL  Sensory/Perceptual disturbances:   WNL  Orientation: oriented to person, place, time/date, and situation  Attention: Good  Concentration: Good  Memory: WNL  Fund of knowledge:  Good  Insight:   Good  Judgment:  Good  Impulse Control: Good   Risk Assessment: Danger to Self:  No Self-injurious Behavior: No Danger to Others: No Duty to Warn:no Physical Aggression / Violence:No  Access to Firearms a concern: No  Gang Involvement:No   Subjective: Counselor assessed pt current functioning per pt report.  Processed w/ pt recent increased anxiety.  Explored contributing factors and grounding skills and self care to assist.  Discussed positive steps w/ talking through plan today w/ PCP and feeling more confident that she is able to manage.  Assisted w/ reframing distortions and identifying boundaries for self to be consistent w/ values.   Pt affect wnl. Pt reported it has been a difficult week.  She reports that she has been feeling heightened anxiety since client was reflecting on grateful she had kids to help take care of her during medical issue.  Pt discussed how sent into thoughts of childless widow w/ not supports.  Pt was able to recognize as distortion and that does have supports.  Pt discussed how she has also came to realize her partner isn't financial stable and doesn't have supports can turn to.  Pt dicussed how she has step some boundaries w/ need to focus on  friendships as well. Pt discussed steps she is taking w/ this.  Pt reported her PCP did increase Prozac and to utilize her PRN Lorazepam as prescribed.   Interventions: Cognitive Behavioral Therapy, Solution-Oriented/Positive Psychology, and Boundaries  Diagnosis:Anxiety disorder, unspecified type  Depression, unspecified depression type  Plan: Pt to f/u in 3 weeks w/ counseling.  Pt to f/u as scheduled w/ PCP.    Individualized Treatment Plan Strengths: enjoys travel, time w/ friends, walking dog, enjoys time at lake.  Pt working out consistently.   Supports: friends, good friend that lost her husband 6 months prior to her.  Pt is close to her family.     Goal/Needs for Treatment:  In order of importance to patient 1) decreased anxiety, irritability and depressed mood.  2) --- 3) ---    Client Statement of Needs: Sense of wellbeing.  Things I can do or not do to gain a sense of wellbeing.      Treatment Level:outpatient counseling  Symptoms:increased anxiety/ on edge, irritability, overwhelmed.  Pt depressed mood  Client Treatment Preferences:weekly to biweekly counseling.      Healthcare consumer's goal for treatment:   Counselor, [name], [facility] will support the patient's ability to achieve the goals identified. Cognitive Behavioral Therapy, Assertive Communication/Conflict Resolution Training, Relaxation Training, ACT, Humanistic and other evidenced-based practices will be used to promote progress towards healthy functioning.    Healthcare consumer will: Actively participate in therapy, working towards healthy functioning.     *Justification for Continuation/Discontinuation of Goal: R=Revised, O=Ongoing, A=Achieved, D=Discontinued  Goal 1) decrease anxiety and depressive symptoms AEB pt report and therapist observation. Baseline date 10/12/22: Progress towards goal 0; How Often - Daily Target Date Goal Was reviewed Status Code Progress towards goal/Likert rating  10/12/23                             Goal 2) Increased use of effective coping skills to manage stress and process grief per pt report of implementing.    Baseline date 10/12/22: Progress towards goal 0; How Often - Daily Target Date Goal Was reviewed Status Code Progress towards goal  10/12/23                            This plan has been reviewed and created by the following participants:  This plan will be reviewed at least every 12 months. Date Behavioral Health Clinician Date Guardian/Patient   10/12/23               [facility] [name] [facility] 11/01/23 Verbal Consent Provided                   [name] [facility]

## 2023-01-19 NOTE — Progress Notes (Signed)
Subjective:  Patient ID: Megan Garner, female    DOB: [birth year]  Age: 62 y.o. MRN: [id number]  CC: Follow-up (Pt is here to discuss medications for her anxiety)   HPI Megan Garner presents for severe anxiety. 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 Very anxious, tearful in the daytime. Able to sleep well. Psychology f/u  appt is this afternoon. Her family is in [place]. Dating now  Outpatient Medications Prior to Visit  Medication Sig Dispense Refill   Cholecalciferol (VITAMIN D3) 50 MCG (2000 UT) capsule Take 1 capsule (2,000 Units total) by mouth daily. 100 capsule 3   esomeprazole (NEXIUM) 20 MG capsule Take 20 mg by mouth daily at 12 noon.     Omega-3 Fatty Acids (FISH OIL) 1000 MG CAPS Take 1 each by mouth daily.       predniSONE (DELTASONE) 20 MG tablet TAKE 1 TABLET EVERY DAY AS NEEDED 30 tablet 0   rosuvastatin (CRESTOR) 5 MG tablet TAKE 1 TABLET BY MOUTH DAILY 90 tablet 3   valACYclovir (VALTREX) 1000 MG tablet Take 1 by mouth twice a day as needed 30 tablet 0   escitalopram (LEXAPRO) 10 MG tablet Take 1 tablet (10 mg total) by mouth daily. 90 tablet 1   LORazepam (ATIVAN) 1 MG tablet TAKE 1/2 TO 1 TABLET BY MOUTH TWICE A DAY AS NEEDED FOR ANXIETY/SLEEP 60 tablet 2   No facility-administered medications prior to visit.    ROS: Review of Systems  Constitutional:  Negative for activity change, appetite change, chills, fatigue and unexpected weight change.  HENT:  Negative for congestion, mouth sores and sinus pressure.   Eyes:  Negative for visual disturbance.  Respiratory:  Negative for cough and chest tightness.   Gastrointestinal:  Negative for abdominal pain and nausea.  Genitourinary:  Negative for difficulty urinating, frequency and vaginal pain.  Musculoskeletal:  Negative for back pain and gait problem.  Skin:  Negative for pallor and rash.  Neurological:  Negative for dizziness, tremors, weakness,  numbness and headaches.  Psychiatric/Behavioral:  Positive for decreased concentration and dysphoric mood. Negative for confusion, sleep disturbance and suicidal ideas. The patient is nervous/anxious.     Objective:  BP 133/84 (BP Location: Left Arm, Patient Position: Sitting, Cuff Size: Normal)   Pulse (!) 111   Temp 98.3 F (36.8 C) (Oral)   Ht 5\' 9"  (1.753 m)   Wt 174 lb (78.9 kg)   LMP 03/24/2011   SpO2 97%   BMI 25.70 kg/m   BP Readings from Last 3 Encounters:  01/19/23 133/84  09/27/22 120/66  09/13/21 114/76    Wt Readings from Last 3 Encounters:  01/19/23 174 lb (78.9 kg)  09/27/22 176 lb (79.8 kg)  09/13/21 169 lb (76.7 kg)    Physical Exam Constitutional:      General: She is not in acute distress.    Appearance: She is well-developed.  HENT:     Head: Normocephalic.     Right Ear: External ear normal.     Left Ear: External ear normal.     Nose: Nose normal.  Eyes:     General:        Right eye: No discharge.        Left eye: No discharge.     Conjunctiva/sclera: Conjunctivae normal.     Pupils: Pupils are equal, round, and reactive to light.  Neck:     Thyroid: No thyromegaly.     Vascular:  No JVD.     Trachea: No tracheal deviation.  Cardiovascular:     Rate and Rhythm: Normal rate and regular rhythm.     Heart sounds: Normal heart sounds.  Pulmonary:     Effort: No respiratory distress.     Breath sounds: No stridor. No wheezing.  Abdominal:     General: Bowel sounds are normal. There is no distension.     Palpations: Abdomen is soft. There is no mass.     Tenderness: There is no abdominal tenderness. There is no guarding or rebound.  Musculoskeletal:        General: No tenderness.     Cervical back: Normal range of motion and neck supple. No rigidity.  Lymphadenopathy:     Cervical: No cervical adenopathy.  Skin:    Findings: No erythema or rash.  Neurological:     Cranial Nerves: No cranial nerve deficit.     Motor: No abnormal muscle  tone.     Coordination: Coordination normal.     Deep Tendon Reflexes: Reflexes normal.  Psychiatric:        Behavior: Behavior normal.        Thought Content: Thought content normal.        Judgment: Judgment normal.   Tearful and upset  Lab Results  Component Value Date   WBC 5.4 08/31/2022   HGB 13.7 08/31/2022   HCT 41.7 08/31/2022   PLT 190.0 08/31/2022   GLUCOSE 111 (H) 08/31/2022   CHOL 156 08/31/2022   TRIG 79.0 08/31/2022   HDL 60.80 08/31/2022   LDLCALC 79 08/31/2022   ALT 20 08/31/2022   AST 22 08/31/2022   NA 136 08/31/2022   K 4.1 08/31/2022   CL 100 08/31/2022   CREATININE 0.67 08/31/2022   BUN 16 08/31/2022   CO2 27 08/31/2022   TSH 2.06 08/31/2022    US THYROID Result Date: 10/04/2022 CLINICAL DATA:  Goiter. EXAM: THYROID ULTRASOUND TECHNIQUE: Ultrasound examination of the thyroid gland and adjacent soft tissues was performed. COMPARISON:  None Available. FINDINGS: Parenchymal Echotexture: Normal Isthmus: 0.3 cm Right lobe: 5.7 x 2.2 x 3.7 cm Left lobe: 3.9 x 1.4 x 1.1 cm _________________________________________________________ Estimated total number of nodules >/= 1 cm: 2 Number of spongiform nodules >/=  2 cm not described below (TR1): 0 Number of mixed cystic and solid nodules >/= 1.5 cm not described below (TR2): 0 _________________________________________________________ Nodule # 1: Location: Right; Mid Maximum size: 5.2 cm; Other 2 dimensions: 3.0 x 2.2 cm Composition: spongiform (0) Echogenicity: anechoic (0) Shape: not taller-than-wide (0) Margins: smooth (0) Echogenic foci: none (0) ACR TI-RADS total points: 0. ACR TI-RADS risk category: TR1 (0-1 points). ACR TI-RADS recommendations: This nodule does NOT meet TI-RADS criteria for biopsy or dedicated follow-up. _________________________________________________________ Nodule # 2: Location: Right; Inferior Maximum size: 1.1 cm; Other 2 dimensions: 0.7 x 0.7 cm Composition: solid/almost completely solid (2)  Echogenicity: isoechoic (1) Shape: not taller-than-wide (0) Margins: smooth (0) Echogenic foci: none (0) ACR TI-RADS total points: 3. ACR TI-RADS risk category: TR3 (3 points). ACR TI-RADS recommendations: Given size (<1.4 cm) and appearance, this nodule does NOT meet TI-RADS criteria for biopsy or dedicated follow-up. This area of nodularity is likely a pseudo nodule of normal thyroid tissue just inferior to the larger spongiform nodule. _________________________________________________________ No enlarged or abnormal appearing lymph nodes are identified. IMPRESSION: 1. Spongiform nodule in the right lobe occupying much of the right lobe and not meeting criteria for biopsy or dedicated follow-up. 2. 1.1 cm  area of nodularity in the inferior right lobe is felt to likely represent a pseudo nodule of normal thyroid tissue just inferior to the larger spongiform nodule. The above is in keeping with the ACR TI-RADS recommendations - J Am Coll Radiol 2017;14:587-595. Electronically Signed   By: Irish Lack M.D.   On: 10/04/2022 16:33    Assessment & Plan:   Problem List Items Addressed This Visit     Anxiety disorder - Primary   Worse: severe anxiety. 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 Very anxious, tearful in the daytime. Able to sleep well. Psychology f/u  appt is this afternoon. Her family is in [place]. Dating now. Taking Lorazepam <0.5 mg/d... Increase Lexapro to 20 mg/d OK to take  more Lorazepam prn as on Rx Abilify low dose if not better in 2-3 d  Potential benefits of a long term benzodiazepines  use as well as potential risks  and complications were explained to the patient and were aknowledged. See Leanne more often       Relevant Medications   escitalopram (LEXAPRO) 20 MG tablet   LORazepam (ATIVAN) 1 MG tablet      Meds ordered this encounter  Medications   escitalopram (LEXAPRO) 20 MG tablet    Sig: Take 1  tablet (20 mg total) by mouth daily.    Dispense:  90 tablet    Refill:  1   LORazepam (ATIVAN) 1 MG tablet    Sig: TAKE 1/2 TO 1 TABLET BY MOUTH TWICE A DAY AS NEEDED FOR ANXIETY/SLEEP    Dispense:  60 tablet    Refill:  2    This request is for a new prescription for a controlled substance as required by Federal/State law.   ARIPiprazole (ABILIFY) 2 MG tablet    Sig: Take 1 tablet (2 mg total) by mouth daily.    Dispense:  30 tablet    Refill:  2      Follow-up: Return in about 6 weeks (around 03/02/2023) for a follow-up visit.  Sonda Primes, MD

## 2023-01-23 ENCOUNTER — Ambulatory Visit: Payer: BC Managed Care – PPO | Admitting: Psychology

## 2023-01-23 DIAGNOSIS — F419 Anxiety disorder, unspecified: Secondary | ICD-10-CM | POA: Diagnosis not present

## 2023-01-23 DIAGNOSIS — F32A Depression, unspecified: Secondary | ICD-10-CM

## 2023-01-23 NOTE — Progress Notes (Signed)
[facility] Counselor/Therapist Progress Note  Patient ID: Megan Garner, MRN: [id number],    Date: 01/23/2023  Time Spent: 12:01pm-12:34pm  Pt is seen for virtual video visit via caregility. Pt joins from her home, reporting privacy, and counselor from her home office.  Pt consent to virtual visit and is aware of limitations of such visits. .   Treatment Type: Individual Therapy  Reported Symptoms: Pt reports some improvement w/ anxiety.  Pt recognized increased anxiety w/ boyfriend visiting.    Mental Status Exam: Appearance:  Well Groomed     Behavior: Appropriate  Motor: Normal  Speech/Language:  Clear and Coherent  Affect: Appropriate  Mood: anxious  Thought process: normal  Thought content:   WNL  Sensory/Perceptual disturbances:   WNL  Orientation: oriented to person, place, time/date, and situation  Attention: Good  Concentration: Good  Memory: WNL  Fund of knowledge:  Good  Insight:   Good  Judgment:  Good  Impulse Control: Good   Risk Assessment: Danger to Self:  No Self-injurious Behavior: No Danger to Others: No Duty to Warn:no Physical Aggression / Violence:No  Access to Firearms a concern: No  Gang Involvement:No   Subjective: Counselor assessed pt current functioning per pt report.  Processed w/ pt recent anxiety, coping skills and self care plan.  Explored positives w/ activities for coping and what assisted in managing anxiety.  Discussed continued boundaries in relationship and how benefiting for her self care.  Explored ways to engage w/ holidays and family functioning and self care and permission to seek grounding when needs.   Pt affect congruent w/ report of tired this morning.  Pt reports she had good Thursday night and Friday felt more calm and then w/ boyfriend visit over weekend more anxious again.  Pt reports she used prn meds and has reduced back today,  pt reports exercise for coping, connecting w/ friends and planning for time apart  from relationship.  Pt reports looking forward to family visit for holidays and aware may need to step away to ground when overstimulated- pt identifies walks and supports to utilize there.    Interventions: Cognitive Behavioral Therapy, Solution-Oriented/Positive Psychology, and Boundaries  Diagnosis:Anxiety disorder, unspecified type  Depression, unspecified depression type  Plan: Pt to f/u in 3 weeks w/ counseling.  Pt to f/u as scheduled w/ PCP.    Individualized Treatment Plan Strengths: enjoys travel, time w/ friends, walking dog, [place] time [place].  Pt working out consistently.   Supports: friends, good friend that lost her husband 6 months prior to her.  Pt is close to her family.     Goal/Needs for Treatment:  In order of importance to patient 1) decreased anxiety, irritability and depressed mood.  2) --- 3) ---    Client Statement of Needs: Sense of wellbeing.  Things I can do or not do to gain a sense of wellbeing.      Treatment Level:outpatient counseling  Symptoms:increased anxiety/ on edge, irritability, overwhelmed.  Pt depressed mood  Client Treatment Preferences:weekly to biweekly counseling.      Healthcare consumer's goal for treatment:   Counselor, Megan Garner, [facility] will support the patient's ability to achieve the goals identified. Cognitive Behavioral Therapy, Assertive Communication/Conflict Resolution Training, Relaxation Training, ACT, Humanistic and other evidenced-based practices will be used to promote progress towards healthy functioning.    Healthcare consumer will: Actively participate in therapy, working towards healthy functioning.     *Justification for Continuation/Discontinuation of Goal: R=Revised, O=Ongoing, A=Achieved, D=Discontinued  Goal 1) decrease anxiety and depressive symptoms AEB pt report and therapist observation. Baseline date 10/12/22: Progress towards goal 0; How Often - Daily Target Date Goal Was reviewed Status Code Progress  towards goal/Likert rating  10/12/23                            Goal 2) Increased use of effective coping skills to manage stress and process grief per pt report of implementing.    Baseline date 10/12/22: Progress towards goal 0; How Often - Daily Target Date Goal Was reviewed Status Code Progress towards goal  10/12/23                            This plan has been reviewed and created by the following participants:  This plan will be reviewed at least every 12 months. Date Behavioral Health Clinician Date Guardian/Patient   10/12/23               [name] 11/01/23 Verbal Consent Provided                     Megan Garner [name]

## 2023-01-25 DIAGNOSIS — M51372 Other intervertebral disc degeneration, lumbosacral region with discogenic back pain and lower extremity pain: Secondary | ICD-10-CM | POA: Diagnosis not present

## 2023-01-25 DIAGNOSIS — M9903 Segmental and somatic dysfunction of lumbar region: Secondary | ICD-10-CM | POA: Diagnosis not present

## 2023-02-16 ENCOUNTER — Ambulatory Visit (INDEPENDENT_AMBULATORY_CARE_PROVIDER_SITE_OTHER): Payer: BC Managed Care – PPO | Admitting: Psychology

## 2023-02-16 DIAGNOSIS — F32A Depression, unspecified: Secondary | ICD-10-CM

## 2023-02-16 DIAGNOSIS — F419 Anxiety disorder, unspecified: Secondary | ICD-10-CM | POA: Diagnosis not present

## 2023-02-16 NOTE — Progress Notes (Signed)
 [facility] Counselor/Therapist Progress Note  Patient ID: Megan Garner, MRN: [id number],    Date: 02/16/2023  Time Spent: 3:30pm-4:32pm  Pt is seen for an in person visit.  Treatment Type: Individual Therapy  Reported Symptoms: Pt reports anxiety through holidays.  Pt reports took weekend for self care and boundaries in relationship.    Mental Status Exam: Appearance:  Well Groomed     Behavior: Appropriate  Motor: Normal  Speech/Language:  Clear and Coherent  Affect: Appropriate  Mood: anxious  Thought process: normal  Thought content:   WNL  Sensory/Perceptual disturbances:   WNL  Orientation: oriented to person, place, time/date, and situation  Attention: Good  Concentration: Good  Memory: WNL  Fund of knowledge:  Good  Insight:   Good  Judgment:  Good  Impulse Control: Good   Risk Assessment: Danger to Self:  No Self-injurious Behavior: No Danger to Others: No Duty to Warn:no Physical Aggression / Violence:No  Access to Firearms a concern: No  Gang Involvement:No   Subjective: Counselor assessed pt current functioning per pt report.  Processed w/ pt recent anxiety and coping skills using.  Explored visit for holidays to family.  Discussed noticing improvement in past couple days w/ taking time for self this past weekend.  Pt reports positive to have break from relationship, time w/ friends, visiting new church.  Pt discussed how exercise benefiting her.  Pt reported that she had recognized some role trauma playing w/ husband death and how may be emerging for self now.  Pt shared story of husband's death and stressors w/ stepson that followed.    Interventions: Cognitive Behavioral Therapy, Solution-Oriented/Positive Psychology, and Boundaries  Diagnosis:Anxiety disorder, unspecified type  Depression, unspecified depression type  Plan: Pt to f/u in 1 week w/ counseling.  Pt to f/u as scheduled w/ PCP.  Pt referred to community psychiatrist.    Individualized Treatment Plan Strengths: enjoys travel, time w/ friends, walking dog, enjoys time at lake.  Pt working out consistently.   Supports: friends, good friend that lost her husband 6 months prior to her.  Pt is close to her family.     Goal/Needs for Treatment:  In order of importance to patient 1) decreased anxiety, irritability and depressed mood.  2) --- 3) ---    Client Statement of Needs: Sense of wellbeing.  Things I can do or not do to gain a sense of wellbeing.      Treatment Level:outpatient counseling  Symptoms:increased anxiety/ on edge, irritability, overwhelmed.  Pt depressed mood  Client Treatment Preferences:weekly to biweekly counseling.      Healthcare consumer's goal for treatment:   Counselor, Damien Herald, [facility] will support the patient's ability to achieve the goals identified. Cognitive Behavioral Therapy, Assertive Communication/Conflict Resolution Training, Relaxation Training, ACT, Humanistic and other evidenced-based practices will be used to promote progress towards healthy functioning.    Healthcare consumer will: Actively participate in therapy, working towards healthy functioning.     *Justification for Continuation/Discontinuation of Goal: R=Revised, O=Ongoing, A=Achieved, D=Discontinued   Goal 1) decrease anxiety and depressive symptoms AEB pt report and therapist observation. Baseline date 10/12/22: Progress towards goal 0; How Often - Daily Target Date Goal Was reviewed Status Code Progress towards goal/Likert rating  10/12/23                            Goal 2) Increased use of effective coping skills to manage stress and process grief per pt report  of implementing.    Baseline date 10/12/22: Progress towards goal 0; How Often - Daily Target Date Goal Was reviewed Status Code Progress towards goal  10/12/23                            This plan has been reviewed and created by the following participants:  This plan will be reviewed at  least every 12 months. Date Behavioral Health Clinician Date Guardian/Patient   10/12/23               [facility] 11/01/23 Verbal Consent Provided                    [facility]

## 2023-02-22 DIAGNOSIS — M51372 Other intervertebral disc degeneration, lumbosacral region with discogenic back pain and lower extremity pain: Secondary | ICD-10-CM | POA: Diagnosis not present

## 2023-02-22 DIAGNOSIS — M9903 Segmental and somatic dysfunction of lumbar region: Secondary | ICD-10-CM | POA: Diagnosis not present

## 2023-02-23 ENCOUNTER — Ambulatory Visit: Payer: BC Managed Care – PPO | Admitting: Psychology

## 2023-02-23 DIAGNOSIS — F32A Depression, unspecified: Secondary | ICD-10-CM | POA: Diagnosis not present

## 2023-02-23 DIAGNOSIS — F419 Anxiety disorder, unspecified: Secondary | ICD-10-CM | POA: Diagnosis not present

## 2023-02-23 NOTE — Progress Notes (Signed)
[facility] Counselor/Therapist Progress Note  Patient ID: Megan Garner, MRN: [id number],    Date: 02/23/2023  Time Spent: 2:30pm-3:28pm  Pt is seen for an in person visit.  Treatment Type: Individual Therapy  Reported Symptoms: Pt reports overall less anxious.  Still periods of unsettled.  Pt reports connecting w/ others and setting healthy boundaries.  Mental Status Exam: Appearance:  Well Groomed     Behavior: Appropriate  Motor: Normal  Speech/Language:  Clear and Coherent  Affect: Appropriate  Mood: anxious  Thought process: normal  Thought content:   WNL  Sensory/Perceptual disturbances:   WNL  Orientation: oriented to person, place, time/date, and situation  Attention: Good  Concentration: Good  Memory: WNL  Fund of knowledge:  Good  Insight:   Good  Judgment:  Good  Impulse Control: Good   Risk Assessment: Danger to Self:  No Self-injurious Behavior: No Danger to Others: No Duty to Warn:no Physical Aggression / Violence:No  Access to Firearms a concern: No  Gang Involvement:No   Subjective: Counselor assessed pt current functioning per pt report.  Processed w/ pt anxiety, grief and trauma.  Explored use of coping skills.  Discussed PCL-5 screener and how trauma impacting her.  Encouraged to continue to engage w/ things that give opportunity for connection and health boundaries w/ relationships.  Pt affect wnl.  Pt reports overall less anxiety- w/ some days increased.  Pt reports only taking reduced dose of ativan prescribed at night.  Pt reports moments of tension, on edge, feeling unsettle in system.  Pt reports weekend was good w/ boyfriend and set boundaries that needed.  Pt scored 39 on PCL5- reexperiencing symptoms minimal.  Pt recognizing that trauma husband's death impacting and that hasn't felt joy since.  Pt discussed ways of connecting and engaging and when to focus on time for self as well.    Interventions: Cognitive Behavioral Therapy,  Solution-Oriented/Positive Psychology, and Boundaries  Diagnosis:Anxiety disorder, unspecified type  Depression, unspecified depression type  Plan: Pt to f/u in 1 week w/ counseling.  Pt to f/u as scheduled w/ PCP.  Pt referred to community psychiatrist.   Individualized Treatment Plan Strengths: enjoys travel, time w/ friends, walking dog, [place] time [place].  Pt working out consistently.   Supports: friends, good friend that lost her husband 6 months prior to her.  Pt is close to her family.     Goal/Needs for Treatment:  In order of importance to patient 1) decreased anxiety, irritability and depressed mood.  2) --- 3) ---    Client Statement of Needs: Sense of wellbeing.  Things I can do or not do to gain a sense of wellbeing.      Treatment Level:outpatient counseling  Symptoms:increased anxiety/ on edge, irritability, overwhelmed.  Pt depressed mood  Client Treatment Preferences:weekly to biweekly counseling.      Healthcare consumer's goal for treatment:   Counselor, [name], [facility] will support the patient's ability to achieve the goals identified. Cognitive Behavioral Therapy, Assertive Communication/Conflict Resolution Training, Relaxation Training, ACT, Humanistic and other evidenced-based practices will be used to promote progress towards healthy functioning.    Healthcare consumer will: Actively participate in therapy, working towards healthy functioning.     *Justification for Continuation/Discontinuation of Goal: R=Revised, O=Ongoing, A=Achieved, D=Discontinued   Goal 1) decrease anxiety and depressive symptoms AEB pt report and therapist observation. Baseline date 10/12/22: Progress towards goal 0; How Often - Daily Target Date Goal Was reviewed Status Code Progress towards goal/Likert rating  10/12/23  Goal 2) Increased use of effective coping skills to manage stress and process grief per pt report of implementing.    Baseline date  10/12/22: Progress towards goal 0; How Often - Daily Target Date Goal Was reviewed Status Code Progress towards goal  10/12/23                            This plan has been reviewed and created by the following participants:  This plan will be reviewed at least every 12 months. Date Behavioral Health Clinician Date Guardian/Patient   10/12/23               [facility] [name] [facility] 11/01/23 Verbal Consent Provided                       [name] [facility]

## 2023-03-02 ENCOUNTER — Ambulatory Visit: Payer: BC Managed Care – PPO | Admitting: Psychology

## 2023-03-02 DIAGNOSIS — F32A Depression, unspecified: Secondary | ICD-10-CM | POA: Diagnosis not present

## 2023-03-02 DIAGNOSIS — F419 Anxiety disorder, unspecified: Secondary | ICD-10-CM | POA: Diagnosis not present

## 2023-03-02 NOTE — Progress Notes (Signed)
[facility] Counselor/Therapist Progress Note  Patient ID: Megan Garner, MRN: [id number],    Date: 03/02/2023  Time Spent: 4:30pm-5:30pm  Pt is seen for an in person visit.  Treatment Type: Individual Therapy  Reported Symptoms: Pt reports more anxious at beginning or week.  More settled past coupled days.  Pt reports positives today.     Mental Status Exam: Appearance:  Well Groomed     Behavior: Appropriate  Motor: Normal  Speech/Language:  Clear and Coherent  Affect: Appropriate  Mood: anxious  Thought process: normal  Thought content:   WNL  Sensory/Perceptual disturbances:   WNL  Orientation: oriented to person, place, time/date, and situation  Attention: Good  Concentration: Good  Memory: WNL  Fund of knowledge:  Good  Insight:   Good  Judgment:  Good  Impulse Control: Good   Risk Assessment: Danger to Self:  No Self-injurious Behavior: No Danger to Others: No Duty to Warn:no Physical Aggression / Violence:No  Access to Firearms a concern: No  Gang Involvement:No   Subjective: Counselor assessed pt current functioning per pt report.  Processed w/ pt anxiety and coping.  Explored connections and coping skills.  Provided psychoed re: anxiety, trauma and facilitating grounding activities and to feel in control in body.   Discussed how to connect w/ ways to ground and soothing system.  Encouraged to add practices doing at other time in day besides night routine.   Pt affect wnl.  Pt reports overall less anxiety- w/ some increase at the beginning of week.  Pt was able to identify some stressor that may have contributing to begin of week.  Pt discussed positive interactions, use of exercise that has been good.  Pt continuing to look for ways ot connecting socializing and w/ her community.  Pt recognized that use of grounding breath/stretch at night but not during day.  Pt receptive to use at other times more consistent.  Interventions: Cognitive Behavioral  Therapy, Solution-Oriented/Positive Psychology, and Boundaries  Diagnosis:Anxiety disorder, unspecified type  Depression, unspecified depression type  Plan: Pt to f/u in 1 week w/ counseling.  Pt to f/u as scheduled w/ PCP.  Pt referred to community psychiatrist.   Individualized Treatment Plan Strengths: enjoys travel, time w/ friends, walking dog, [place] time [place].  Pt working out consistently.   Supports: friends, good friend that lost her husband 6 months prior to her.  Pt is close to her family.     Goal/Needs for Treatment:  In order of importance to patient 1) decreased anxiety, irritability and depressed mood.  2) --- 3) ---    Client Statement of Needs: Sense of wellbeing.  Things I can do or not do to gain a sense of wellbeing.      Treatment Level:outpatient counseling  Symptoms:increased anxiety/ on edge, irritability, overwhelmed.  Pt depressed mood  Client Treatment Preferences:weekly to biweekly counseling.      Healthcare consumer's goal for treatment:   Counselor, [name], [facility] will support the patient's ability to achieve the goals identified. Cognitive Behavioral Therapy, Assertive Communication/Conflict Resolution Training, Relaxation Training, ACT, Humanistic and other evidenced-based practices will be used to promote progress towards healthy functioning.    Healthcare consumer will: Actively participate in therapy, working towards healthy functioning.     *Justification for Continuation/Discontinuation of Goal: R=Revised, O=Ongoing, A=Achieved, D=Discontinued   Goal 1) decrease anxiety and depressive symptoms AEB pt report and therapist observation. Baseline date 10/12/22: Progress towards goal 0; How Often - Daily Target Date Goal Was  reviewed Status Code Progress towards goal/Likert rating  10/12/23                            Goal 2) Increased use of effective coping skills to manage stress and process grief per pt report of implementing.     Baseline date 10/12/22: Progress towards goal 0; How Often - Daily Target Date Goal Was reviewed Status Code Progress towards goal  10/12/23                            This plan has been reviewed and created by the following participants:  This plan will be reviewed at least every 12 months. Date Behavioral Health Clinician Date Guardian/Patient   10/12/23               [facility] [name] [facility] 11/01/23 Verbal Consent Provided                            [name] [facility]

## 2023-03-04 ENCOUNTER — Other Ambulatory Visit: Payer: Self-pay | Admitting: Internal Medicine

## 2023-03-09 ENCOUNTER — Ambulatory Visit: Payer: BC Managed Care – PPO | Admitting: Psychology

## 2023-03-09 DIAGNOSIS — F419 Anxiety disorder, unspecified: Secondary | ICD-10-CM

## 2023-03-09 DIAGNOSIS — F32A Depression, unspecified: Secondary | ICD-10-CM

## 2023-03-09 NOTE — Progress Notes (Signed)
[facility] Counselor/Therapist Progress Note  Patient ID: Megan Garner, MRN: [id number],    Date: 03/09/2023  Time Spent: 10:00am-10:53am  Pt is seen for an in person visit.  Treatment Type: Individual Therapy  Reported Symptoms: Pt reports feeling less anxious overall, pt reports improved engagement.  Pt reports mornings difficult, feeling sad, not wanting to get out of bed. Mental Status Exam: Appearance:  Well Groomed     Behavior: Appropriate  Motor: Normal  Speech/Language:  Clear and Coherent  Affect: Appropriate  Mood: sad  Thought process: normal  Thought content:   WNL  Sensory/Perceptual disturbances:   WNL  Orientation: oriented to person, place, time/date, and situation  Attention: Good  Concentration: Good  Memory: WNL  Fund of knowledge:  Good  Insight:   Good  Judgment:  Good  Impulse Control: Good   Risk Assessment: Danger to Self:  No Self-injurious Behavior: No Danger to Others: No Duty to Warn:no Physical Aggression / Violence:No  Access to Firearms a concern: No  Gang Involvement:No   Subjective: Counselor assessed pt current functioning per pt report.  Processed w/ pt moods and coping.  Explored positive engagement and connections- reflected positive outcomes.  Validated feelings of sadness and grief and impact on mornings and getting motivated.  Explored ways to acknowledge emotions of grief and recognize ways to be present in the day.    Pt affect wnl.  Pt reports overall less anxiety and engaging w/ others and in community. Pt sees improvement w/ want to do things again.  Pt reports that struggled since death of husband w/ getting up in morning and that has been more intense recently.  Pt discussed once up and moving ok and usually feels good if can have release through crying.  Pt receptive to ways of bring more movement, steps towards getting up- sitting up, opening blinds, naming emotions, naming intention for day.   Interventions:  Cognitive Behavioral Therapy, Solution-Oriented/Positive Psychology, and Boundaries  Diagnosis:Anxiety disorder, unspecified type  Depression, unspecified depression type  Plan: Pt to f/u in 1 week w/ counseling.  Pt to f/u as scheduled w/ PCP.  Pt referred to community psychiatrist.   Individualized Treatment Plan Strengths: enjoys travel, time w/ friends, walking dog, [place] time [place].  Pt working out consistently.   Supports: friends, good friend that lost her husband 6 months prior to her.  Pt is close to her family.     Goal/Needs for Treatment:  In order of importance to patient 1) decreased anxiety, irritability and depressed mood.  2) --- 3) ---    Client Statement of Needs: Sense of wellbeing.  Things I can do or not do to gain a sense of wellbeing.      Treatment Level:outpatient counseling  Symptoms:increased anxiety/ on edge, irritability, overwhelmed.  Pt depressed mood  Client Treatment Preferences:weekly to biweekly counseling.      Healthcare consumer's goal for treatment:   Counselor, [name], [facility] will support the patient's ability to achieve the goals identified. Cognitive Behavioral Therapy, Assertive Communication/Conflict Resolution Training, Relaxation Training, ACT, Humanistic and other evidenced-based practices will be used to promote progress towards healthy functioning.    Healthcare consumer will: Actively participate in therapy, working towards healthy functioning.     *Justification for Continuation/Discontinuation of Goal: R=Revised, O=Ongoing, A=Achieved, D=Discontinued   Goal 1) decrease anxiety and depressive symptoms AEB pt report and therapist observation. Baseline date 10/12/22: Progress towards goal 0; How Often - Daily Target Date Goal Was reviewed Status Code  Progress towards goal/Likert rating  10/12/23                            Goal 2) Increased use of effective coping skills to manage stress and process grief per pt report of  implementing.    Baseline date 10/12/22: Progress towards goal 0; How Often - Daily Target Date Goal Was reviewed Status Code Progress towards goal  10/12/23                            This plan has been reviewed and created by the following participants:  This plan will be reviewed at least every 12 months. Date Behavioral Health Clinician Date Guardian/Patient   10/12/23               [facility] [name] [facility] 11/01/23 Verbal Consent Provided                 [name] [facility]

## 2023-03-15 ENCOUNTER — Ambulatory Visit: Payer: BC Managed Care – PPO | Admitting: Internal Medicine

## 2023-03-16 ENCOUNTER — Ambulatory Visit: Payer: BC Managed Care – PPO | Admitting: Psychology

## 2023-03-16 DIAGNOSIS — F419 Anxiety disorder, unspecified: Secondary | ICD-10-CM | POA: Diagnosis not present

## 2023-03-16 DIAGNOSIS — F32A Depression, unspecified: Secondary | ICD-10-CM

## 2023-03-16 NOTE — Progress Notes (Signed)
 [facility] Counselor/Therapist Progress Note  Patient ID: Megan Garner, MRN: [id number],    Date: 03/16/2023  Time Spent: 3:31pm-4:16pm  Pt is seen for an in person visit.  Treatment Type: Individual Therapy  Reported Symptoms: Pt reports feeling less anxious overall.  Pt reports feelings of sadness and loneliness.  Mental Status Exam: Appearance:  Well Groomed     Behavior: Appropriate  Motor: Normal  Speech/Language:  Clear and Coherent  Affect: Appropriate  Mood: sad  Thought process: normal  Thought content:   WNL  Sensory/Perceptual disturbances:   WNL  Orientation: oriented to person, place, time/date, and situation  Attention: Good  Concentration: Good  Memory: WNL  Fund of knowledge:  Good  Insight:   Good  Judgment:  Good  Impulse Control: Good   Risk Assessment: Danger to Self:  No Self-injurious Behavior: No Danger to Others: No Duty to Warn:no Physical Aggression / Violence:No  Access to Firearms a concern: No  Gang Involvement:No   Subjective: Counselor assessed pt current functioning per pt report.  Processed w/ pt moods and coping. Discussed space for expressing sadness and emotions related to grief.  Explored w/ pt relationship and her questioning relationship.  Encouraged pt continued connection w/ friends and time for self outside of relationship.    Pt affect wnl.  Pt reports overall less anxiety and expresses more sadness and feeling of loneliness related to grief.  Pt discussed how recognizing want to explore whether current relationship is working for her or not. Pt recognizes positives of time she has w/ friends and time to self for self care.    Interventions: Cognitive Behavioral Therapy, Solution-Oriented/Positive Psychology, and Boundaries  Diagnosis:Anxiety disorder, unspecified type  Depression, unspecified depression type  Plan: Pt to f/u in 1 week w/ counseling.  Pt to f/u as scheduled w/ PCP.  Pt reports scheduled w/ Dr.  Vincente  next week.  Individualized Treatment Plan Strengths: enjoys travel, time w/ friends, walking dog, enjoys time at lake.  Pt working out consistently.   Supports: friends, good friend that lost her husband 6 months prior to her.  Pt is close to her family.     Goal/Needs for Treatment:  In order of importance to patient 1) decreased anxiety, irritability and depressed mood.  2) --- 3) ---    Client Statement of Needs: Sense of wellbeing.  Things I can do or not do to gain a sense of wellbeing.      Treatment Level:outpatient counseling  Symptoms:increased anxiety/ on edge, irritability, overwhelmed.  Pt depressed mood  Client Treatment Preferences:weekly to biweekly counseling.      Healthcare consumer's goal for treatment:   Counselor, Megan Garner, [facility] will support the patient's ability to achieve the goals identified. Cognitive Behavioral Therapy, Assertive Communication/Conflict Resolution Training, Relaxation Training, ACT, Humanistic and other evidenced-based practices will be used to promote progress towards healthy functioning.    Healthcare consumer will: Actively participate in therapy, working towards healthy functioning.     *Justification for Continuation/Discontinuation of Goal: R=Revised, O=Ongoing, A=Achieved, D=Discontinued   Goal 1) decrease anxiety and depressive symptoms AEB pt report and therapist observation. Baseline date 10/12/22: Progress towards goal 0; How Often - Daily Target Date Goal Was reviewed Status Code Progress towards goal/Likert rating  10/12/23                            Goal 2) Increased use of effective coping skills to manage stress and process  grief per pt report of implementing.    Baseline date 10/12/22: Progress towards goal 0; How Often - Daily Target Date Goal Was reviewed Status Code Progress towards goal  10/12/23                            This plan has been reviewed and created by the following participants:  This plan will be  reviewed at least every 12 months. Date Behavioral Health Clinician Date Guardian/Patient   10/12/23               [facility] Megan [facility] 11/01/23 Verbal Consent Provided               Megan Garner LCM[facility]

## 2023-03-22 DIAGNOSIS — F332 Major depressive disorder, recurrent severe without psychotic features: Secondary | ICD-10-CM | POA: Diagnosis not present

## 2023-03-22 DIAGNOSIS — F411 Generalized anxiety disorder: Secondary | ICD-10-CM | POA: Diagnosis not present

## 2023-03-23 ENCOUNTER — Ambulatory Visit: Payer: BC Managed Care – PPO | Admitting: Psychology

## 2023-03-23 DIAGNOSIS — F419 Anxiety disorder, unspecified: Secondary | ICD-10-CM | POA: Diagnosis not present

## 2023-03-23 DIAGNOSIS — F32A Depression, unspecified: Secondary | ICD-10-CM

## 2023-03-23 NOTE — Progress Notes (Signed)
[facility] Counselor/Therapist Progress Note  Patient ID: Megan Garner, MRN: [id number],    Date: 03/23/2023  Time Spent: 1:31pm-2:28pm  Pt is seen for an in person visit.  Treatment Type: Individual Therapy  Reported Symptoms: Pt reports mood improved this past week.  Less down and less anxious.   Mental Status Exam: Appearance:  Well Groomed     Behavior: Appropriate  Motor: Normal  Speech/Language:  Clear and Coherent  Affect: Appropriate  Mood: Some worry  Thought process: normal  Thought content:   WNL  Sensory/Perceptual disturbances:   WNL  Orientation: oriented to person, place, time/date, and situation  Attention: Good  Concentration: Good  Memory: WNL  Fund of knowledge:  Good  Insight:   Good  Judgment:  Good  Impulse Control: Good   Risk Assessment: Danger to Self:  No Self-injurious Behavior: No Danger to Others: No Duty to Warn:no Physical Aggression / Violence:No  Access to Firearms a concern: No  Gang Involvement:No   Subjective: Counselor assessed pt current functioning per pt report.  Processed w/ pt moods, positives and stressors.  Explored positive interactions and engagement and ways to continue to be intentional about.  Explored relationship stressors and healthy boundaries setting.   Pt affect bright.  Pt reports this past week she has felt improved w/ mood- less depressed and less anxious.  Pt reports she really enjoyed getting out w/ partner and snow tubing this past weekend.  Pt reports on plans to continue to engage w friends through week and weekends.  Pt discussed stressors in relationship and how handles finances.  Pt discussed that set boundary of not going on vacation together until he pays loan back to her.    Interventions: Cognitive Behavioral Therapy, Solution-Oriented/Positive Psychology, and Boundaries  Diagnosis:Anxiety disorder, unspecified type  Depression, unspecified depression type  Plan: Pt to f/u in 1 week w/  counseling.  Pt to f/u as scheduled w/ PCP.  Pt reports scheduled w/ Dr. Evelene Croon  next week.  Individualized Treatment Plan Strengths: enjoys travel, time w/ friends, walking dog, enjoys time at lake.  Pt working out consistently.   Supports: friends, good friend that lost her husband 6 months prior to her.  Pt is close to her family.     Goal/Needs for Treatment:  In order of importance to patient 1) decreased anxiety, irritability and depressed mood.  2) --- 3) ---    Client Statement of Needs: Sense of wellbeing.  Things I can do or not do to gain a sense of wellbeing.      Treatment Level:outpatient counseling  Symptoms:increased anxiety/ on edge, irritability, overwhelmed.  Pt depressed mood  Client Treatment Preferences:weekly to biweekly counseling.      Healthcare consumer's goal for treatment:   Counselor, Megan Garner, [facility] will support the patient's ability to achieve the goals identified. Cognitive Behavioral Therapy, Assertive Communication/Conflict Resolution Training, Relaxation Training, ACT, Humanistic and other evidenced-based practices will be used to promote progress towards healthy functioning.    Healthcare consumer will: Actively participate in therapy, working towards healthy functioning.     *Justification for Continuation/Discontinuation of Goal: R=Revised, O=Ongoing, A=Achieved, D=Discontinued   Goal 1) decrease anxiety and depressive symptoms AEB pt report and therapist observation. Baseline date 10/12/22: Progress towards goal 0; How Often - Daily Target Date Goal Was reviewed Status Code Progress towards goal/Likert rating  10/12/23  Goal 2) Increased use of effective coping skills to manage stress and process grief per pt report of implementing.    Baseline date 10/12/22: Progress towards goal 0; How Often - Daily Target Date Goal Was reviewed Status Code Progress towards goal  10/12/23                            This plan has  been reviewed and created by the following participants:  This plan will be reviewed at least every 12 months. Date Behavioral Health Clinician Date Guardian/Patient   10/12/23               [facility] Megan Garner [place] 11/01/23 Verbal Consent Provided            Megan Garner [facility]

## 2023-03-29 DIAGNOSIS — M51372 Other intervertebral disc degeneration, lumbosacral region with discogenic back pain and lower extremity pain: Secondary | ICD-10-CM | POA: Diagnosis not present

## 2023-03-29 DIAGNOSIS — M9903 Segmental and somatic dysfunction of lumbar region: Secondary | ICD-10-CM | POA: Diagnosis not present

## 2023-03-30 ENCOUNTER — Ambulatory Visit: Payer: BC Managed Care – PPO | Admitting: Psychology

## 2023-03-31 ENCOUNTER — Ambulatory Visit: Payer: BC Managed Care – PPO | Admitting: Internal Medicine

## 2023-04-06 ENCOUNTER — Ambulatory Visit: Payer: BC Managed Care – PPO | Admitting: Psychology

## 2023-04-06 DIAGNOSIS — F32A Depression, unspecified: Secondary | ICD-10-CM | POA: Diagnosis not present

## 2023-04-06 DIAGNOSIS — F419 Anxiety disorder, unspecified: Secondary | ICD-10-CM | POA: Diagnosis not present

## 2023-04-06 NOTE — Progress Notes (Signed)
 [facility] Counselor/Therapist Progress Note  Patient ID: Megan Garner, MRN: [id number],    Date: 04/06/2023  Time Spent: 11:02am-11:58am  Pt is seen for an in person visit.  Treatment Type: Individual Therapy  Reported Symptoms: Pt reports mood continues to be improved. Less down and less anxious. Couple days of down mood.   Mental Status Exam: Appearance:  Well Groomed     Behavior: Appropriate  Motor: Normal  Speech/Language:  Clear and Coherent  Affect: Appropriate  Mood: normal  Thought process: normal  Thought content:   WNL  Sensory/Perceptual disturbances:   WNL  Orientation: oriented to person, place, time/date, and situation  Attention: Good  Concentration: Good  Memory: WNL  Fund of knowledge:  Good  Insight:   Good  Judgment:  Good  Impulse Control: Good   Risk Assessment: Danger to Self:  No Self-injurious Behavior: No Danger to Others: No Duty to Warn:no Physical Aggression / Violence:No  Access to Firearms a concern: No  Gang Involvement:No   Subjective: Counselor assessed pt current functioning per pt report.  Processed w/ pt moods, positives and stressors.  Explored days of down mood and contributing factors. Discussed coping skills, plans for engaging socially and w/ things that she enjoys and gives purpose.  Pt affect wnl.  Pt reports mood continues to be improved- less depressed and less anxious overall.  Pt reports she did feel some down days and contributes to not feeling well and wasn't able to work out those days.   Pt reports feeling more stable and confidence w/ coping w/ stressors or emotions.  Pt things she is looking forward to and plans upcoming w/ engaging w/ community.  Pt discussed how that is beneficial for her.      Interventions: Cognitive Behavioral Therapy, Solution-Oriented/Positive Psychology, and Boundaries  Diagnosis:Anxiety disorder, unspecified type  Depression, unspecified depression type  Plan: Pt to f/u in 2  weeks w/ counseling.  Pt to f/u as scheduled w/ PCP.  Pt to f/u w/ Dr. Evelene Croon as scheduled.   Individualized Treatment Plan Strengths: enjoys travel, time w/ friends, walking dog, enjoys time at lake.  Pt working out consistently.   Supports: friends, good friend that lost her husband 6 months prior to her.  Pt is close to her family.     Goal/Needs for Treatment:  In order of importance to patient 1) decreased anxiety, irritability and depressed mood.  2) --- 3) ---    Client Statement of Needs: Sense of wellbeing.  Things I can do or not do to gain a sense of wellbeing.      Treatment Level:outpatient counseling  Symptoms:increased anxiety/ on edge, irritability, overwhelmed.  Pt depressed mood  Client Treatment Preferences:weekly to biweekly counseling.      Healthcare consumer's goal for treatment:   Counselor, [name], [facility] will support the patient's ability to achieve the goals identified. Cognitive Behavioral Therapy, Assertive Communication/Conflict Resolution Training, Relaxation Training, ACT, Humanistic and other evidenced-based practices will be used to promote progress towards healthy functioning.    Healthcare consumer will: Actively participate in therapy, working towards healthy functioning.     *Justification for Continuation/Discontinuation of Goal: R=Revised, O=Ongoing, A=Achieved, D=Discontinued   Goal 1) decrease anxiety and depressive symptoms AEB pt report and therapist observation. Baseline date 10/12/22: Progress towards goal 0; How Often - Daily Target Date Goal Was reviewed Status Code Progress towards goal/Likert rating  10/12/23  Goal 2) Increased use of effective coping skills to manage stress and process grief per pt report of implementing.    Baseline date 10/12/22: Progress towards goal 0; How Often - Daily Target Date Goal Was reviewed Status Code Progress towards goal  10/12/23                            This plan has  been reviewed and created by the following participants:  This plan will be reviewed at least every 12 months. Date Behavioral Health Clinician Date Guardian/Patient   10/12/23               [facility] [name] 11/01/23 Verbal Consent Provided              [name] [facility]

## 2023-04-14 ENCOUNTER — Ambulatory Visit: Payer: BC Managed Care – PPO | Admitting: Internal Medicine

## 2023-04-20 ENCOUNTER — Ambulatory Visit: Payer: BC Managed Care – PPO | Admitting: Psychology

## 2023-04-20 DIAGNOSIS — F32A Depression, unspecified: Secondary | ICD-10-CM

## 2023-04-20 DIAGNOSIS — F419 Anxiety disorder, unspecified: Secondary | ICD-10-CM | POA: Diagnosis not present

## 2023-04-20 NOTE — Progress Notes (Signed)
 [facility] Counselor/Therapist Progress Note  Patient ID: Megan Garner, MRN: [id number],    Date: 04/20/2023  Time Spent: 11:30am-12:17pm  Pt is seen for an in person visit.  Treatment Type: Individual Therapy  Reported Symptoms: Pt reports less down moods.  Pt reports couple of mornings w/ physical anxiety/feeling on edge, but feels that managing.    Mental Status Exam: Appearance:  Well Groomed     Behavior: Appropriate  Motor: Normal  Speech/Language:  Clear and Coherent  Affect: Appropriate  Mood: anxious  Thought process: normal  Thought content:   WNL  Sensory/Perceptual disturbances:   WNL  Orientation: oriented to person, place, time/date, and situation  Attention: Good  Concentration: Good  Memory: WNL  Fund of knowledge:  Good  Insight:   Good  Judgment:  Good  Impulse Control: Good   Risk Assessment: Danger to Self:  No Self-injurious Behavior: No Danger to Others: No Duty to Warn:no Physical Aggression / Violence:No  Access to Firearms a concern: No  Gang Involvement:No   Subjective: Counselor assessed pt current functioning per pt report.  Processed w/ pt moods and how utilizing coping skills.  Reflected positive boundaries set in relationship.  Discussed ways she is feeling connected with others and exploring other areas for continued growth.  Pt affect bright.  Pt reports moods have been good overall.  Pt reports no depressed moods and decreased anxiety overall.  Pt does report noticing couple morning of feeling anxious in body.  Pt reports that feels she is able to deescalate and feeling positive about moving forward.  Pt reports she is looking forward to her dive trip and that she just booked trip abroad w/ friend for later this year.  Pt discussed how she had to set boundary w/ partner about not trying to guilt for travel w/out him and how positive for her to have connections w/ others.  Pt discussed ways she is feeling connected and other  options for continuing to build community.     Interventions: Cognitive Behavioral Therapy, Solution-Oriented/Positive Psychology, and Boundaries  Diagnosis:Anxiety disorder, unspecified type  Depression, unspecified depression type  Plan: Pt to f/u in 2 weeks w/ counseling.  Pt to f/u as scheduled w/ PCP.  Pt to f/u w/ Dr. Evelene Croon as scheduled.   Individualized Treatment Plan Strengths: enjoys travel, time w/ friends, walking dog, enjoys time at lake.  Pt working out consistently.   Supports: friends, good friend that lost her husband 6 months prior to her.  Pt is close to her family.     Goal/Needs for Treatment:  In order of importance to patient 1) decreased anxiety, irritability and depressed mood.  2) --- 3) ---    Client Statement of Needs: Sense of wellbeing.  Things I can do or not do to gain a sense of wellbeing.      Treatment Level:outpatient counseling  Symptoms:increased anxiety/ on edge, irritability, overwhelmed.  Pt depressed mood  Client Treatment Preferences:weekly to biweekly counseling.      Healthcare consumer's goal for treatment:   Counselor, Megan Garner, [facility] will support the patient's ability to achieve the goals identified. Cognitive Behavioral Therapy, Assertive Communication/Conflict Resolution Training, Relaxation Training, ACT, Humanistic and other evidenced-based practices will be used to promote progress towards healthy functioning.    Healthcare consumer will: Actively participate in therapy, working towards healthy functioning.     *Justification for Continuation/Discontinuation of Goal: R=Revised, O=Ongoing, A=Achieved, D=Discontinued   Goal 1) decrease anxiety and depressive symptoms AEB pt report and  therapist observation. Baseline date 10/12/22: Progress towards goal 0; How Often - Daily Target Date Goal Was reviewed Status Code Progress towards goal/Likert rating  10/12/23                            Goal 2) Increased use of effective coping  skills to manage stress and process grief per pt report of implementing.    Baseline date 10/12/22: Progress towards goal 0; How Often - Daily Target Date Goal Was reviewed Status Code Progress towards goal  10/12/23                            This plan has been reviewed and created by the following participants:  This plan will be reviewed at least every 12 months. Date Behavioral Health Clinician Date Guardian/Patient   10/12/23               [facility] Megan Garner [id number] [facility] 11/01/23 Verbal Consent Provided                Megan Garner [facility]

## 2023-04-26 DIAGNOSIS — M9903 Segmental and somatic dysfunction of lumbar region: Secondary | ICD-10-CM | POA: Diagnosis not present

## 2023-04-26 DIAGNOSIS — M51372 Other intervertebral disc degeneration, lumbosacral region with discogenic back pain and lower extremity pain: Secondary | ICD-10-CM | POA: Diagnosis not present

## 2023-04-27 ENCOUNTER — Ambulatory Visit: Payer: BC Managed Care – PPO | Admitting: Psychology

## 2023-05-03 ENCOUNTER — Encounter: Payer: Self-pay | Admitting: Internal Medicine

## 2023-05-03 ENCOUNTER — Ambulatory Visit: Admitting: Internal Medicine

## 2023-05-03 VITALS — BP 118/88 | HR 72 | Ht 69.0 in | Wt 172.8 lb

## 2023-05-03 DIAGNOSIS — F419 Anxiety disorder, unspecified: Secondary | ICD-10-CM

## 2023-05-03 DIAGNOSIS — F411 Generalized anxiety disorder: Secondary | ICD-10-CM | POA: Diagnosis not present

## 2023-05-03 DIAGNOSIS — J4520 Mild intermittent asthma, uncomplicated: Secondary | ICD-10-CM | POA: Diagnosis not present

## 2023-05-03 DIAGNOSIS — R252 Cramp and spasm: Secondary | ICD-10-CM | POA: Insufficient documentation

## 2023-05-03 DIAGNOSIS — E782 Mixed hyperlipidemia: Secondary | ICD-10-CM

## 2023-05-03 DIAGNOSIS — F3342 Major depressive disorder, recurrent, in full remission: Secondary | ICD-10-CM | POA: Diagnosis not present

## 2023-05-03 DIAGNOSIS — I2583 Coronary atherosclerosis due to lipid rich plaque: Secondary | ICD-10-CM

## 2023-05-03 DIAGNOSIS — Z0000 Encounter for general adult medical examination without abnormal findings: Secondary | ICD-10-CM

## 2023-05-03 MED ORDER — LORAZEPAM 1 MG PO TABS: ORAL_TABLET | ORAL | 2 refills | Status: AC

## 2023-05-03 NOTE — Assessment & Plan Note (Signed)
 New cramps due to Crestor. Hold x 1 wk, then re-start 2 d/week

## 2023-05-03 NOTE — Assessment & Plan Note (Signed)
No relapse COVID19 prevention discussed

## 2023-05-03 NOTE — Progress Notes (Signed)
 Subjective:  Patient ID: Megan Garner, female    DOB: 1960-05-12  Age: 63 y.o. MRN: [id number]  CC: Medical Management of Chronic Issues (Patient changed from lexapro to viibryd by psychiatrist Dr.Kaur. Patient noted no issues, still some anxiety noted) and Dysmenorrhea (Patient experiencing some leg/foot cramping that they believe might be due to their statin script. Has been occurring for the last 6 months. Especially noticeable when doing yoga or mobility class. Notes episodes only last for a couple of minutes and subside after stretching or stopping the motion they were doing)   HPI Megan Garner presents for Medical Management of Chronic Issues (Patient changed from lexapro to viibryd by psychiatrist Dr.Kaur. Patient noted no issues, still some anxiety noted) and Dysmenorrhea (Patient experiencing some leg/foot cramping that they believe might be due to their statin script. Has been occurring for the last 6 months. Especially noticeable when doing yoga or mobility class. Notes episodes only last for a couple of minutes and subside after stretching or stopping the motion they were doing)  Outpatient Medications Prior to Visit  Medication Sig Dispense Refill  . Cholecalciferol (VITAMIN D3) 50 MCG (2000 UT) capsule Take 1 capsule (2,000 Units total) by mouth daily. 100 capsule 3  . Magnesium 100 MG CAPS Take 100 mg by mouth daily.    . Omega-3 Fatty Acids (FISH OIL) 1000 MG CAPS Take 1 each by mouth daily.      . predniSONE (DELTASONE) 20 MG tablet TAKE 1 TABLET EVERY DAY AS NEEDED 30 tablet 0  . rosuvastatin (CRESTOR) 5 MG tablet TAKE 1 TABLET BY MOUTH DAILY 90 tablet 3  . valACYclovir (VALTREX) 1000 MG tablet Take 1 by mouth twice a day as needed 30 tablet 0  . Vilazodone HCl (VIIBRYD) 40 MG TABS Take 40 mg by mouth daily.    Marland Kitchen LORazepam (ATIVAN) 1 MG tablet TAKE 1/2 TO 1 TABLET BY MOUTH TWICE A DAY AS NEEDED FOR ANXIETY/SLEEP 60 tablet 2  . ARIPiprazole (ABILIFY) 2 MG tablet Take 1 tablet (2 mg  total) by mouth daily. 30 tablet 2  . escitalopram (LEXAPRO) 20 MG tablet Take 1 tablet (20 mg total) by mouth daily. 90 tablet 1  . esomeprazole (NEXIUM) 20 MG capsule Take 20 mg by mouth daily at 12 noon.     No facility-administered medications prior to visit.    ROS: Review of Systems  Constitutional:  Negative for activity change, appetite change, chills, fatigue and unexpected weight change.  HENT:  Negative for congestion, mouth sores and sinus pressure.   Eyes:  Negative for visual disturbance.  Respiratory:  Negative for cough and chest tightness.   Gastrointestinal:  Negative for abdominal pain and nausea.  Genitourinary:  Negative for difficulty urinating, frequency and vaginal pain.  Musculoskeletal:  Negative for back pain and gait problem.  Skin:  Negative for pallor and rash.  Neurological:  Negative for dizziness, tremors, weakness, numbness and headaches.  Psychiatric/Behavioral:  Negative for confusion and sleep disturbance.     Objective:  BP 118/88   Pulse 72   Ht 5\' 9"  (1.753 m)   Wt 172 lb 12.8 oz (78.4 kg)   LMP 03/24/2011   SpO2 97%   BMI 25.52 kg/m   BP Readings from Last 3 Encounters:  05/03/23 118/88  01/19/23 133/84  09/27/22 120/66    Wt Readings from Last 3 Encounters:  05/03/23 172 lb 12.8 oz (78.4 kg)  01/19/23 174 lb (78.9 kg)  09/27/22 176 lb (79.8 kg)  Physical Exam Constitutional:      General: She is not in acute distress.    Appearance: She is well-developed.  HENT:     Head: Normocephalic.     Right Ear: External ear normal.     Left Ear: External ear normal.     Nose: Nose normal.  Eyes:     General:        Right eye: No discharge.        Left eye: No discharge.     Conjunctiva/sclera: Conjunctivae normal.     Pupils: Pupils are equal, round, and reactive to light.  Neck:     Thyroid: No thyromegaly.     Vascular: No JVD.     Trachea: No tracheal deviation.  Cardiovascular:     Rate and Rhythm: Normal rate and  regular rhythm.     Heart sounds: Normal heart sounds.  Pulmonary:     Effort: No respiratory distress.     Breath sounds: No stridor. No wheezing.  Abdominal:     General: Bowel sounds are normal. There is no distension.     Palpations: Abdomen is soft. There is no mass.     Tenderness: There is no abdominal tenderness. There is no guarding or rebound.  Musculoskeletal:        General: No tenderness.     Cervical back: Normal range of motion and neck supple. No rigidity.  Lymphadenopathy:     Cervical: No cervical adenopathy.  Skin:    Findings: No erythema or rash.  Neurological:     Cranial Nerves: No cranial nerve deficit.     Motor: No abnormal muscle tone.     Coordination: Coordination normal.     Gait: Gait abnormal.     Deep Tendon Reflexes: Reflexes normal.  Psychiatric:        Behavior: Behavior normal.        Thought Content: Thought content normal.        Judgment: Judgment normal.    Lab Results  Component Value Date   WBC 5.4 08/31/2022   HGB 13.7 08/31/2022   HCT 41.7 08/31/2022   PLT 190.0 08/31/2022   GLUCOSE 111 (H) 08/31/2022   CHOL 156 08/31/2022   TRIG 79.0 08/31/2022   HDL 60.80 08/31/2022   LDLCALC 79 08/31/2022   ALT 20 08/31/2022   AST 22 08/31/2022   NA 136 08/31/2022   K 4.1 08/31/2022   CL 100 08/31/2022   CREATININE 0.67 08/31/2022   BUN 16 08/31/2022   CO2 27 08/31/2022   TSH 2.06 08/31/2022    US THYROID Result Date: 10/04/2022 CLINICAL DATA:  Goiter. EXAM: THYROID ULTRASOUND TECHNIQUE: Ultrasound examination of the thyroid gland and adjacent soft tissues was performed. COMPARISON:  None Available. FINDINGS: Parenchymal Echotexture: Normal Isthmus: 0.3 cm Right lobe: 5.7 x 2.2 x 3.7 cm Left lobe: 3.9 x 1.4 x 1.1 cm _________________________________________________________ Estimated total number of nodules >/= 1 cm: 2 Number of spongiform nodules >/=  2 cm not described below (TR1): 0 Number of mixed cystic and solid nodules >/= 1.5 cm  not described below (TR2): 0 _________________________________________________________ Nodule # 1: Location: Right; Mid Maximum size: 5.2 cm; Other 2 dimensions: 3.0 x 2.2 cm Composition: spongiform (0) Echogenicity: anechoic (0) Shape: not taller-than-wide (0) Margins: smooth (0) Echogenic foci: none (0) ACR TI-RADS total points: 0. ACR TI-RADS risk category: TR1 (0-1 points). ACR TI-RADS recommendations: This nodule does NOT meet TI-RADS criteria for biopsy or dedicated follow-up. _________________________________________________________ Nodule # 2:  Location: Right; Inferior Maximum size: 1.1 cm; Other 2 dimensions: 0.7 x 0.7 cm Composition: solid/almost completely solid (2) Echogenicity: isoechoic (1) Shape: not taller-than-wide (0) Margins: smooth (0) Echogenic foci: none (0) ACR TI-RADS total points: 3. ACR TI-RADS risk category: TR3 (3 points). ACR TI-RADS recommendations: Given size (<1.4 cm) and appearance, this nodule does NOT meet TI-RADS criteria for biopsy or dedicated follow-up. This area of nodularity is likely a pseudo nodule of normal thyroid tissue just inferior to the larger spongiform nodule. _________________________________________________________ No enlarged or abnormal appearing lymph nodes are identified. IMPRESSION: 1. Spongiform nodule in the right lobe occupying much of the right lobe and not meeting criteria for biopsy or dedicated follow-up. 2. 1.1 cm area of nodularity in the inferior right lobe is felt to likely represent a pseudo nodule of normal thyroid tissue just inferior to the larger spongiform nodule. The above is in keeping with the ACR TI-RADS recommendations - J Am Coll Radiol 2017;14:587-595. Electronically Signed   By: Irish Lack M.D.   On: 10/04/2022 16:33    Assessment & Plan:   Problem List Items Addressed This Visit     Asthma   No relapse COVID19 prevention discussed      Well adult exam   Relevant Orders   TSH   Urinalysis   CBC with  Differential/Platelet   Lipid panel   Comprehensive metabolic panel   Anxiety disorder - Primary   Worse: severe anxiety. 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 Very anxious, tearful in the daytime. Able to sleep well. Psychology f/u  appt is this afternoon. Her family is in [place]. Dating now. Taking Lorazepam <0.5 mg/d... Increase Lexapro to 20 mg/d OK to take  more Lorazepam prn as on Rx Abilify low dose if not better in 2-3 d  Potential benefits of a long term benzodiazepines  use as well as potential risks  and complications were explained to the patient and were aknowledged. See Leanne more often       Relevant Medications   Vilazodone HCl (VIIBRYD) 40 MG TABS   LORazepam (ATIVAN) 1 MG tablet      Meds ordered this encounter  Medications  . LORazepam (ATIVAN) 1 MG tablet    Sig: TAKE 1/2 TO 1 TABLET BY MOUTH TWICE A DAY AS NEEDED FOR ANXIETY/SLEEP    Dispense:  60 tablet    Refill:  2      Follow-up: Return in about 6 months (around 11/03/2023) for Wellness Exam.  Sonda Primes, MD

## 2023-05-03 NOTE — Assessment & Plan Note (Signed)
 New - due to Crestor. Hold x 1 wk, then re-start 2 d/week

## 2023-05-03 NOTE — Assessment & Plan Note (Addendum)
 Better Dating. Taking Lorazepam <0.5 mg/d... D/c'd Lexapro, no on Viibrid per Dr Evelene Croon D/c Abilify low dose (never took it)  Potential benefits of a long term benzodiazepines  use as well as potential risks  and complications were explained to the patient and were aknowledged. F/u w/Leanne

## 2023-05-11 ENCOUNTER — Ambulatory Visit: Admitting: Psychology

## 2023-05-11 DIAGNOSIS — F419 Anxiety disorder, unspecified: Secondary | ICD-10-CM

## 2023-05-11 NOTE — Progress Notes (Signed)
 [facility] Counselor/Therapist Progress Note  Patient ID: Megan Garner, MRN: [id number],    Date: 05/11/2023  Time Spent: 2:30pm-3:19pm  Pt is seen for an in person visit.  Treatment Type: Individual Therapy  Reported Symptoms: Pt reports less down moods.  Pt reports some increased anxiety and rumination/mind racing.    Mental Status Exam: Appearance:  Well Groomed     Behavior: Appropriate  Motor: Normal  Speech/Language:  Clear and Coherent  Affect: Appropriate  Mood: anxious  Thought process: normal  Thought content:   WNL  Sensory/Perceptual disturbances:   WNL  Orientation: oriented to person, place, time/date, and situation  Attention: Good  Concentration: Good  Memory: WNL  Fund of knowledge:  Good  Insight:   Good  Judgment:  Good  Impulse Control: Good   Risk Assessment: Danger to Self:  No Self-injurious Behavior: No Danger to Others: No Duty to Warn:no Physical Aggression / Violence:No  Access to Firearms a concern: No  Gang Involvement:No   Subjective: Counselor assessed pt current functioning per pt report.  Processed w/ pt moods and some increased anxiety.  Explored potential contributing factors.  Discussed thoughts related difficulty allowing self to 'do nothing".  Assisted in reframing and encouraging to be intentional about.   Pt affect wnl.  Pt reports that she has noticed more anxiety recent.  Pt also reports more rumination/mind racing.  Pt reports some things feels that impacting- as gets closer to retirement worry about transition and not having things to do or being ok w/ slowing down.  Pt reports that she also feels some anxiety w/ upcoming trip.  Pt is able reframe worries and be encouraging for self.  Pt discussed awareness of difficulty slowing down to do nothing and just relax.  Pt reports that is looking forward to to this on her trip and doesn't have problem doing so when no vacation.  Pt recognizes need to reframe distortions about  down time/relaxation and given self permission for and practicing daily/weekly.    Interventions: Cognitive Behavioral Therapy, Solution-Oriented/Positive Psychology, and Boundaries  Diagnosis:Anxiety disorder, unspecified type  Plan: Pt to f/u in 2 weeks w/ counseling.  Pt to f/u as scheduled w/ PCP.  Pt to f/u w/ Dr. Evelene Croon as scheduled.   Individualized Treatment Plan Strengths: enjoys travel, time w/ friends, walking dog, enjoys time at lake.  Pt working out consistently.   Supports: friends, good friend that lost her husband 6 months prior to her.  Pt is close to her family.     Goal/Needs for Treatment:  In order of importance to patient 1) decreased anxiety, irritability and depressed mood.  2) --- 3) ---    Client Statement of Needs: Sense of wellbeing.  Things I can do or not do to gain a sense of wellbeing.      Treatment Level:outpatient counseling  Symptoms:increased anxiety/ on edge, irritability, overwhelmed.  Pt depressed mood  Client Treatment Preferences:weekly to biweekly counseling.      Healthcare consumer's goal for treatment:   Counselor, [name], [facility] will support the patient's ability to achieve the goals identified. Cognitive Behavioral Therapy, Assertive Communication/Conflict Resolution Training, Relaxation Training, ACT, Humanistic and other evidenced-based practices will be used to promote progress towards healthy functioning.    Healthcare consumer will: Actively participate in therapy, working towards healthy functioning.     *Justification for Continuation/Discontinuation of Goal: R=Revised, O=Ongoing, A=Achieved, D=Discontinued   Goal 1) decrease anxiety and depressive symptoms AEB pt report and therapist observation. Baseline date  10/12/22: Progress towards goal 0; How Often - Daily Target Date Goal Was reviewed Status Code Progress towards goal/Likert rating  10/12/23                            Goal 2) Increased use of effective coping skills  to manage stress and process grief per pt report of implementing.    Baseline date 10/12/22: Progress towards goal 0; How Often - Daily Target Date Goal Was reviewed Status Code Progress towards goal  10/12/23                            This plan has been reviewed and created by the following participants:  This plan will be reviewed at least every 12 months. Date Behavioral Health Clinician Date Guardian/Patient   10/12/23               [name] 11/01/23 Verbal Consent Provided                [name] [facility]

## 2023-05-18 ENCOUNTER — Ambulatory Visit: Payer: BC Managed Care – PPO | Admitting: Psychology

## 2023-05-18 DIAGNOSIS — M51372 Other intervertebral disc degeneration, lumbosacral region with discogenic back pain and lower extremity pain: Secondary | ICD-10-CM | POA: Diagnosis not present

## 2023-05-18 DIAGNOSIS — M9903 Segmental and somatic dysfunction of lumbar region: Secondary | ICD-10-CM | POA: Diagnosis not present

## 2023-06-01 ENCOUNTER — Ambulatory Visit: Admitting: Psychology

## 2023-06-01 DIAGNOSIS — F419 Anxiety disorder, unspecified: Secondary | ICD-10-CM | POA: Diagnosis not present

## 2023-06-01 DIAGNOSIS — F32A Depression, unspecified: Secondary | ICD-10-CM | POA: Diagnosis not present

## 2023-06-01 NOTE — Progress Notes (Signed)
 [facility] Counselor/Therapist Progress Note  Patient ID: Megan Garner, MRN: [id number],    Date: 06/01/2023  Time Spent: 3:35pm-4:28pm  Pt is seen for an in person visit.  Treatment Type: Individual Therapy  Reported Symptoms: Pt reports moods improved- less depressed and anxious.  Pt reports positives w/ trip and recognizing working in times to slow down/decompress. Mental Status Exam: Appearance:  Well Groomed     Behavior: Appropriate  Motor: Normal  Speech/Language:  Clear and Coherent  Affect: Appropriate  Mood: anxious  Thought process: normal  Thought content:   WNL  Sensory/Perceptual disturbances:   WNL  Orientation: oriented to person, place, time/date, and situation  Attention: Good  Concentration: Good  Memory: WNL  Fund of knowledge:  Good  Insight:   Good  Judgment:  Good  Impulse Control: Good   Risk Assessment: Danger to Self:  No Self-injurious Behavior: No Danger to Others: No Duty to Warn:no Physical Aggression / Violence:No  Access to Firearms a concern: No  Gang Involvement:No   Subjective: Counselor assessed pt current functioning per pt report.  Processed w/ pt moods and coping.  Explored recent trip/vacation and benefit of permission to slow down.     Discussed ways to plan for in daily life.    Pt affect wnl.  Pt reports decreased depression and anxiety.  Pt reports her vacation was good- couple mopey moments but was good to decompress and give self permission to not dive on some days.  Pt reports recognized that in retirements and working towards that can find ways of slowing down to be present.  Pt discussed some stressors w/ boyfriend owing money and different approach to money and fiances.  Pt discussed tings looking forward to upcoming.    Interventions: Cognitive Behavioral Therapy, Solution-Oriented/Positive Psychology, and Boundaries  Diagnosis:Anxiety disorder, unspecified type  Depression, unspecified depression  type  Plan: Pt to f/u in 2 weeks w/ counseling.  Pt to f/u as scheduled w/ PCP.  Pt to f/u w/ Dr. Deborra Falter as scheduled.   Individualized Treatment Plan Strengths: enjoys travel, time w/ friends, walking dog, enjoys time at lake.  Pt working out consistently.   Supports: friends, good friend that lost her husband 6 months prior to her.  Pt is close to her family.     Goal/Needs for Treatment:  In order of importance to patient 1) decreased anxiety, irritability and depressed mood.  2) --- 3) ---    Client Statement of Needs: Sense of wellbeing.  Things I can do or not do to gain a sense of wellbeing.      Treatment Level:outpatient counseling  Symptoms:increased anxiety/ on edge, irritability, overwhelmed.  Pt depressed mood  Client Treatment Preferences:weekly to biweekly counseling.      Healthcare consumer's goal for treatment:   Counselor, Clydie Darter, [facility] will support the patient's ability to achieve the goals identified. Cognitive Behavioral Therapy, Assertive Communication/Conflict Resolution Training, Relaxation Training, ACT, Humanistic and other evidenced-based practices will be used to promote progress towards healthy functioning.    Healthcare consumer will: Actively participate in therapy, working towards healthy functioning.     *Justification for Continuation/Discontinuation of Goal: R=Revised, O=Ongoing, A=Achieved, D=Discontinued   Goal 1) decrease anxiety and depressive symptoms AEB pt report and therapist observation. Baseline date 10/12/22: Progress towards goal 0; How Often - Daily Target Date Goal Was reviewed Status Code Progress towards goal/Likert rating  10/12/23  Goal 2) Increased use of effective coping skills to manage stress and process grief per pt report of implementing.    Baseline date 10/12/22: Progress towards goal 0; How Often - Daily Target Date Goal Was reviewed Status Code Progress towards goal  10/12/23                             This plan has been reviewed and created by the following participants:  This plan will be reviewed at least every 12 months. Date Behavioral Health Clinician Date Guardian/Patient   10/12/23               [facility] [name] [facility] 11/01/23 Verbal Consent Provided              Clydie Darter [facility]

## 2023-06-07 DIAGNOSIS — Z01419 Encounter for gynecological examination (general) (routine) without abnormal findings: Secondary | ICD-10-CM | POA: Diagnosis not present

## 2023-06-07 DIAGNOSIS — Z130 Encounter for screening for diseases of the blood and blood-forming organs and certain disorders involving the immune mechanism: Secondary | ICD-10-CM | POA: Diagnosis not present

## 2023-06-07 DIAGNOSIS — R87810 Cervical high risk human papillomavirus (HPV) DNA test positive: Secondary | ICD-10-CM | POA: Diagnosis not present

## 2023-06-08 DIAGNOSIS — M9903 Segmental and somatic dysfunction of lumbar region: Secondary | ICD-10-CM | POA: Diagnosis not present

## 2023-06-08 DIAGNOSIS — M51372 Other intervertebral disc degeneration, lumbosacral region with discogenic back pain and lower extremity pain: Secondary | ICD-10-CM | POA: Diagnosis not present

## 2023-06-14 ENCOUNTER — Telehealth: Admitting: Emergency Medicine

## 2023-06-14 DIAGNOSIS — A09 Infectious gastroenteritis and colitis, unspecified: Secondary | ICD-10-CM | POA: Diagnosis not present

## 2023-06-14 MED ORDER — AZITHROMYCIN 500 MG PO TABS: 500.0000 mg | ORAL_TABLET | Freq: Every day | ORAL | 0 refills | Status: AC

## 2023-06-14 NOTE — Progress Notes (Signed)
 Virtual Visit Consent   Megan Garner, you are scheduled for a virtual visit with a [facility] provider today. Just as with appointments in the office, your consent must be obtained to participate. Your consent will be active for this visit and any virtual visit you may have with one of our providers in the next 365 days. If you have a MyChart account, a copy of this consent can be sent to you electronically.  As this is a virtual visit, video technology does not allow for your provider to perform a traditional examination. This may limit your provider's ability to fully assess your condition. If your provider identifies any concerns that need to be evaluated in person or the need to arrange testing (such as labs, EKG, etc.), we will make arrangements to do so. Although advances in technology are sophisticated, we cannot ensure that it will always work on either your end or our end. If the connection with a video visit is poor, the visit may have to be switched to a telephone visit. With either a video or telephone visit, we are not always able to ensure that we have a secure connection.  By engaging in this virtual visit, you consent to the provision of healthcare and authorize for your insurance to be billed (if applicable) for the services provided during this visit. Depending on your insurance coverage, you may receive a charge related to this service.  I need to obtain your verbal consent now. Are you willing to proceed with your visit today? [name] has provided verbal consent on 06/14/2023 for a virtual visit (video or telephone). Blinda Burger, NP  Date: 06/14/2023 2:38 PM   Virtual Visit via Video Note   I, Blinda Burger, connected with  Megan Garner  ([id number], 11-18-1960) on 06/14/23 at  2:30 PM EDT by a video-enabled telemedicine application and verified that I am speaking with the correct person using two identifiers.  Location: Patient: Virtual Visit Location Patient: Home Provider:  Virtual Visit Location Provider: Home Office   I discussed the limitations of evaluation and management by telemedicine and the availability of in person appointments. The patient expressed understanding and agreed to proceed.    History of Present Illness: Megan Garner is a 63 y.o. who identifies as a female who was assigned female at birth, and is being seen today for  Abd pain, bloating, gas, nausea since trip to [place] 3-4 weeks ago. No diarrhea but stool may be a little looser than normal. Feels "off" every day since returning from trip but is getting more severe sx 2-3 per week, has worse sx right now.   To treat sx, she takes Gas-x with worse sx, probiotics daily, nexium daily after switching from pepcid daily - nothing is  helping.   Denies blood, mucus, or pus in her stool. No vomiting  HPI: HPI  Problems:  Patient Active Problem List   Diagnosis Date Noted   Cramps, extremity 05/03/2023   Goiter 09/27/2022   Fatigue 09/27/2022   Atrophy of vagina 09/13/2021   Postmenopausal state 09/13/2021   Contact dermatitis 09/13/2021   Mixed hyperlipidemia 05/23/2021   Vertigo 12/07/2020   Atherosclerosis of aorta (HCC) 08/06/2020   Coronary atherosclerosis 08/06/2020   Family history of colon cancer in father 03/18/2018   History of melanoma 03/18/2018   Grief 02/24/2017   Anxiety disorder 01/06/2017   Well adult exam 08/27/2013   Warts 04/24/2011   Cold sore 07/02/2007   Acute upper respiratory infection 02/13/2007  Allergic rhinitis 02/13/2007   Asthma 02/13/2007   Melanoma in situ (HCC) 08/08/1983    Allergies: No Known Allergies Medications:  Current Outpatient Medications:    azithromycin  (ZITHROMAX ) 500 MG tablet, Take 1 tablet (500 mg total) by mouth daily for 3 days., Disp: 3 tablet, Rfl: 0   Cholecalciferol (VITAMIN D3) 50 MCG (2000 UT) capsule, Take 1 capsule (2,000 Units total) by mouth daily., Disp: 100 capsule, Rfl: 3   LORazepam  (ATIVAN ) 1 MG tablet, TAKE 1/2  TO 1 TABLET BY MOUTH TWICE A DAY AS NEEDED FOR ANXIETY/SLEEP, Disp: 60 tablet, Rfl: 2   Magnesium 100 MG CAPS, Take 100 mg by mouth daily., Disp: , Rfl:    Omega-3 Fatty Acids (FISH OIL) 1000 MG CAPS, Take 1 each by mouth daily.  , Disp: , Rfl:    predniSONE  (DELTASONE ) 20 MG tablet, TAKE 1 TABLET EVERY DAY AS NEEDED, Disp: 30 tablet, Rfl: 0   rosuvastatin  (CRESTOR ) 5 MG tablet, TAKE 1 TABLET BY MOUTH DAILY, Disp: 90 tablet, Rfl: 3   valACYclovir  (VALTREX ) 1000 MG tablet, Take 1 by mouth twice a day as needed, Disp: 30 tablet, Rfl: 0   Vilazodone HCl (VIIBRYD) 40 MG TABS, Take 40 mg by mouth daily., Disp: , Rfl:   Observations/Objective: Patient is well-developed, well-nourished in no acute distress.  Resting comfortably  at home.  Head is normocephalic, atraumatic.  No labored breathing.  Speech is clear and coherent with logical content.  Patient is alert and oriented at baseline.    Assessment and Plan: 1. Traveler's diarrhea (Primary)  Does not have diarrhea but does have looser stool than normal and other sx c/w possible infection from traveling. Will try azithromycin .   Follow Up Instructions: I discussed the assessment and treatment plan with the patient. The patient was provided an opportunity to ask questions and all were answered. The patient agreed with the plan and demonstrated an understanding of the instructions.  A copy of instructions were sent to the patient via MyChart unless otherwise noted below.    The patient was advised to call back or seek an in-person evaluation if the symptoms worsen or if the condition fails to improve as anticipated.    Blinda Burger, NP

## 2023-06-14 NOTE — Patient Instructions (Signed)
  Megan Garner, thank you for joining Blinda Burger, NP for today's virtual visit.  While this provider is not your primary care provider (PCP), if your PCP is located in our provider database this encounter information will be shared with them immediately following your visit.   A [facility] MyChart account gives you access to today's visit and all your visits, tests, and labs performed at [facility] " click here if you don't have a Megan Garner MyChart account or go to [email or web address]  Consent: (Patient) Megan Garner provided verbal consent for this virtual visit at the beginning of the encounter.  Current Medications:  Current Outpatient Medications:    azithromycin  (ZITHROMAX ) 500 MG tablet, Take 1 tablet (500 mg total) by mouth daily for 3 days., Disp: 3 tablet, Rfl: 0   Cholecalciferol (VITAMIN D3) 50 MCG (2000 UT) capsule, Take 1 capsule (2,000 Units total) by mouth daily., Disp: 100 capsule, Rfl: 3   LORazepam  (ATIVAN ) 1 MG tablet, TAKE 1/2 TO 1 TABLET BY MOUTH TWICE A DAY AS NEEDED FOR ANXIETY/SLEEP, Disp: 60 tablet, Rfl: 2   Magnesium 100 MG CAPS, Take 100 mg by mouth daily., Disp: , Rfl:    Omega-3 Fatty Acids (FISH OIL) 1000 MG CAPS, Take 1 each by mouth daily.  , Disp: , Rfl:    predniSONE  (DELTASONE ) 20 MG tablet, TAKE 1 TABLET EVERY DAY AS NEEDED, Disp: 30 tablet, Rfl: 0   rosuvastatin  (CRESTOR ) 5 MG tablet, TAKE 1 TABLET BY MOUTH DAILY, Disp: 90 tablet, Rfl: 3   valACYclovir  (VALTREX ) 1000 MG tablet, Take 1 by mouth twice a day as needed, Disp: 30 tablet, Rfl: 0   Vilazodone HCl (VIIBRYD) 40 MG TABS, Take 40 mg by mouth daily., Disp: , Rfl:    Medications ordered in this encounter:  Meds ordered this encounter  Medications   azithromycin  (ZITHROMAX ) 500 MG tablet    Sig: Take 1 tablet (500 mg total) by mouth daily for 3 days.    Dispense:  3 tablet    Refill:  0     *If you need refills on other medications prior to your next appointment, please  contact your pharmacy*  Follow-Up: Call back or seek an in-person evaluation if the symptoms worsen or if the condition fails to improve as anticipated.  [facility] [phone]  Other Instructions  Please seek in person care with your primary care provider if this treatment plan does not resolve your symptoms.    If you have been instructed to have an in-person evaluation today at a local Urgent Care facility, please use the link below. It will take you to a list of all of our available [facility], including address, phone number and hours of operation. Please do not delay care.  [facility]  If you or a family member do not have a primary care provider, use the link below to schedule a visit and establish care. When you choose a [facility] primary care physician or advanced practice provider, you gain a long-term partner in health. Find a Primary Care Provider  Learn more about [facility]'s in-office and virtual care options:  - Get Care Now

## 2023-06-22 ENCOUNTER — Ambulatory Visit: Admitting: Psychology

## 2023-06-28 DIAGNOSIS — R87810 Cervical high risk human papillomavirus (HPV) DNA test positive: Secondary | ICD-10-CM | POA: Diagnosis not present

## 2023-06-29 DIAGNOSIS — M9903 Segmental and somatic dysfunction of lumbar region: Secondary | ICD-10-CM | POA: Diagnosis not present

## 2023-06-29 DIAGNOSIS — M51372 Other intervertebral disc degeneration, lumbosacral region with discogenic back pain and lower extremity pain: Secondary | ICD-10-CM | POA: Diagnosis not present

## 2023-07-06 ENCOUNTER — Ambulatory Visit: Admitting: Psychology

## 2023-07-06 DIAGNOSIS — F32A Depression, unspecified: Secondary | ICD-10-CM | POA: Diagnosis not present

## 2023-07-06 DIAGNOSIS — F419 Anxiety disorder, unspecified: Secondary | ICD-10-CM | POA: Diagnosis not present

## 2023-07-06 DIAGNOSIS — Z23 Encounter for immunization: Secondary | ICD-10-CM | POA: Diagnosis not present

## 2023-07-06 NOTE — Progress Notes (Unsigned)
 [facility] Counselor/Therapist Progress Note  Patient ID: Megan Garner, MRN: [id number],    Date: 07/06/2023  Time Spent: 4:33pm-5:38pm  Pt is seen for an in person visit.  Treatment Type: Individual Therapy  Reported Symptoms: Pt reports tearful, down and worry w/ recent separation  Mental Status Exam: Appearance:  Well Groomed     Behavior: Appropriate  Motor: Normal  Speech/Language:  Clear and Coherent  Affect: Tearful  Mood: anxious and sad  Thought process: normal  Thought content:   WNL  Sensory/Perceptual disturbances:   WNL  Orientation: oriented to person, place, time/date, and situation  Attention: Good  Concentration: Good  Memory: WNL  Fund of knowledge:  Good  Insight:   Good  Judgment:  Good  Impulse Control: Good   Risk Assessment: Danger to Self:  No Self-injurious Behavior: No Danger to Others: No Duty to Warn:no Physical Aggression / Violence:No  Access to Firearms a concern: No  Gang Involvement:No   Subjective: Counselor assessed pt current functioning per pt report.  Processed w/ pt emotions w/ recent relationship stressors and separation.  Provided support, validated and normalized feelings.  Discussed her supports, self care and distance from relationship.  Pt affect congruent w/ report of sadness and worry.   Pt reports over last 3 weeks interactions w/ boyfriend, recognizing he had a alcohol problem and separating when his behaviors escalated when intoxicated.  Pt reports she is learning a lot from [employer] literature.  Pt initial no contact.  Pt reports talked last evening and recognized that he is in denial about his problems and impact having.  Pt realization that needs to keep boundaries at this time for self.  Pt reports struggle as does love him and worries for his health and safety w/ his continued drinking.  Pt is able to identify support of pastor, her women's group, some close friends.  Pt reports plan for boundaries/ no  communication at this point.    Interventions: Cognitive Behavioral Therapy, Solution-Oriented/Positive Psychology, and Boundaries  Diagnosis:Anxiety disorder, unspecified type  Depression, unspecified depression type  Plan: Pt to f/u in 2 weeks w/ counseling.  Pt to f/u as scheduled w/ PCP.  Pt to f/u w/ Dr. Deborra Falter as scheduled.   Individualized Treatment Plan Strengths: enjoys travel, time w/ friends, walking dog, [place].  Pt working out consistently.   Supports: friends, good friend that lost her husband 6 months prior to her.  Pt is close to her family.     Goal/Needs for Treatment:  In order of importance to patient 1) decreased anxiety, irritability and depressed mood.  2) --- 3) ---    Client Statement of Needs: Sense of wellbeing.  Things I can do or not do to gain a sense of wellbeing.      Treatment Level:outpatient counseling  Symptoms:increased anxiety/ on edge, irritability, overwhelmed.  Pt depressed mood  Client Treatment Preferences:weekly to biweekly counseling.      Healthcare consumer's goal for treatment:   Counselor, Clydie Darter, [facility] will support the patient's ability to achieve the goals identified. Cognitive Behavioral Therapy, Assertive Communication/Conflict Resolution Training, Relaxation Training, ACT, Humanistic and other evidenced-based practices will be used to promote progress towards healthy functioning.    Healthcare consumer will: Actively participate in therapy, working towards healthy functioning.     *Justification for Continuation/Discontinuation of Goal: R=Revised, O=Ongoing, A=Achieved, D=Discontinued   Goal 1) decrease anxiety and depressive symptoms AEB pt report and therapist observation. Baseline date 10/12/22: Progress towards goal 0;  How Often - Daily Target Date Goal Was reviewed Status Code Progress towards goal/Likert rating  10/12/23                            Goal 2) Increased use of effective coping skills to  manage stress and process grief per pt report of implementing.    Baseline date 10/12/22: Progress towards goal 0; How Often - Daily Target Date Goal Was reviewed Status Code Progress towards goal  10/12/23                            This plan has been reviewed and created by the following participants:  This plan will be reviewed at least every 12 months. Date Behavioral Health Clinician Date Guardian/Patient   10/12/23  [facility] [name] [facility] 11/01/23 Verbal Consent Provided               Clydie Darter LCMHC

## 2023-07-11 ENCOUNTER — Encounter: Payer: Self-pay | Admitting: Internal Medicine

## 2023-07-11 ENCOUNTER — Ambulatory Visit: Admitting: Internal Medicine

## 2023-07-11 VITALS — BP 110/78 | HR 92 | Temp 98.6°F | Ht 69.0 in | Wt 174.0 lb

## 2023-07-11 DIAGNOSIS — Z0000 Encounter for general adult medical examination without abnormal findings: Secondary | ICD-10-CM

## 2023-07-11 DIAGNOSIS — F419 Anxiety disorder, unspecified: Secondary | ICD-10-CM | POA: Diagnosis not present

## 2023-07-11 DIAGNOSIS — R109 Unspecified abdominal pain: Secondary | ICD-10-CM | POA: Diagnosis not present

## 2023-07-11 DIAGNOSIS — R87810 Cervical high risk human papillomavirus (HPV) DNA test positive: Secondary | ICD-10-CM | POA: Insufficient documentation

## 2023-07-11 DIAGNOSIS — Z1211 Encounter for screening for malignant neoplasm of colon: Secondary | ICD-10-CM

## 2023-07-11 LAB — CBC WITH DIFFERENTIAL/PLATELET
Basophils Absolute: 0 10*3/uL (ref 0.0–0.1)
Basophils Relative: 0.4 % (ref 0.0–3.0)
Eosinophils Absolute: 0.1 10*3/uL (ref 0.0–0.7)
Eosinophils Relative: 2.3 % (ref 0.0–5.0)
HCT: 40.7 % (ref 36.0–46.0)
Hemoglobin: 13.7 g/dL (ref 12.0–15.0)
Lymphocytes Relative: 37.9 % (ref 12.0–46.0)
Lymphs Abs: 2.3 10*3/uL (ref 0.7–4.0)
MCHC: 33.7 g/dL (ref 30.0–36.0)
MCV: 91.6 fl (ref 78.0–100.0)
Monocytes Absolute: 0.5 10*3/uL (ref 0.1–1.0)
Monocytes Relative: 7.8 % (ref 3.0–12.0)
Neutro Abs: 3.1 10*3/uL (ref 1.4–7.7)
Neutrophils Relative %: 51.6 % (ref 43.0–77.0)
Platelets: 212 10*3/uL (ref 150.0–400.0)
RBC: 4.44 Mil/uL (ref 3.87–5.11)
RDW: 13.3 % (ref 11.5–15.5)
WBC: 6.1 10*3/uL (ref 4.0–10.5)

## 2023-07-11 LAB — COMPREHENSIVE METABOLIC PANEL WITH GFR
ALT: 19 U/L (ref 0–35)
AST: 20 U/L (ref 0–37)
Albumin: 4.4 g/dL (ref 3.5–5.2)
Alkaline Phosphatase: 79 U/L (ref 39–117)
BUN: 19 mg/dL (ref 6–23)
CO2: 32 meq/L (ref 19–32)
Calcium: 9.4 mg/dL (ref 8.4–10.5)
Chloride: 100 meq/L (ref 96–112)
Creatinine, Ser: 0.73 mg/dL (ref 0.40–1.20)
GFR: 86.6 mL/min (ref >60.00)
Glucose, Bld: 102 mg/dL — ABNORMAL HIGH (ref 70–99)
Potassium: 3.8 meq/L (ref 3.5–5.1)
Sodium: 137 meq/L (ref 135–145)
Total Bilirubin: 0.6 mg/dL (ref 0.2–1.2)
Total Protein: 7.3 g/dL (ref 6.0–8.3)

## 2023-07-11 LAB — LIPASE: Lipase: 43 U/L (ref 11.0–59.0)

## 2023-07-11 MED ORDER — PREDNISONE 20 MG PO TABS: ORAL_TABLET | ORAL | 0 refills | Status: AC

## 2023-07-11 MED ORDER — VALACYCLOVIR HCL 1 G PO TABS: ORAL_TABLET | ORAL | 0 refills | Status: AC

## 2023-07-11 MED ORDER — METRONIDAZOLE 500 MG PO TABS: 500.0000 mg | ORAL_TABLET | Freq: Three times a day (TID) | ORAL | 0 refills | Status: DC

## 2023-07-11 MED ORDER — SACCHAROMYCES BOULARDII 250 MG PO CAPS: 250.0000 mg | ORAL_CAPSULE | Freq: Two times a day (BID) | ORAL | 1 refills | Status: AC

## 2023-07-11 MED ORDER — PANCRELIPASE (LIP-PROT-AMYL) 36000-114000 UNITS PO CPEP: 36000.0000 [IU] | ORAL_CAPSULE | Freq: Three times a day (TID) | ORAL | 3 refills | Status: AC

## 2023-07-11 NOTE — Progress Notes (Signed)
 Subjective:  Patient ID: Megan Garner, female    DOB: September 20, 1960  Age: 63 y.o. MRN: [id number]  CC: Bloated   HPI [name] presents for upper abd cramps  No n/v Overall better  Outpatient Medications Prior to Visit  Medication Sig Dispense Refill   Cholecalciferol (VITAMIN D3) 50 MCG (2000 UT) capsule Take 1 capsule (2,000 Units total) by mouth daily. 100 capsule 3   citalopram  (CELEXA ) 20 MG tablet Take 1 tablet by mouth daily.     LORazepam  (ATIVAN ) 1 MG tablet TAKE 1/2 TO 1 TABLET BY MOUTH TWICE A DAY AS NEEDED FOR ANXIETY/SLEEP 60 tablet 2   Magnesium 100 MG CAPS Take 100 mg by mouth daily.     misoprostol (CYTOTEC) 200 MCG tablet 200 mcg.     Omega-3 Fatty Acids (FISH OIL) 1000 MG CAPS Take 1 each by mouth daily.       rosuvastatin  (CRESTOR ) 5 MG tablet TAKE 1 TABLET BY MOUTH DAILY 90 tablet 3   Vilazodone HCl (VIIBRYD) 40 MG TABS Take 40 mg by mouth daily.     predniSONE  (DELTASONE ) 20 MG tablet TAKE 1 TABLET EVERY DAY AS NEEDED 30 tablet 0   valACYclovir  (VALTREX ) 1000 MG tablet Take 1 by mouth twice a day as needed 30 tablet 0   No facility-administered medications prior to visit.    ROS: Review of Systems  Constitutional:  Negative for activity change, appetite change, chills, fatigue and unexpected weight change.  HENT:  Negative for congestion, mouth sores and sinus pressure.   Eyes:  Negative for visual disturbance.  Respiratory:  Negative for cough and chest tightness.   Gastrointestinal:  Negative for abdominal pain and nausea.  Genitourinary:  Negative for difficulty urinating, frequency and vaginal pain.  Musculoskeletal:  Negative for back pain and gait problem.  Skin:  Negative for pallor and rash.  Neurological:  Negative for dizziness, tremors, weakness, numbness and headaches.  Psychiatric/Behavioral:  Negative for confusion and sleep disturbance.     Objective:  BP 110/78   Pulse 92   Temp 98.6 F (37 C) (Oral)   Ht 5' 9 (1.753 m)   Wt 174 lb  (78.9 kg)   LMP 03/24/2011   SpO2 95%   BMI 25.70 kg/m   BP Readings from Last 3 Encounters:  07/11/23 110/78  05/03/23 118/88  01/19/23 133/84    Wt Readings from Last 3 Encounters:  07/11/23 174 lb (78.9 kg)  05/03/23 172 lb 12.8 oz (78.4 kg)  01/19/23 174 lb (78.9 kg)    Physical Exam Constitutional:      General: She is not in acute distress.    Appearance: She is well-developed.  HENT:     Head: Normocephalic.     Right Ear: External ear normal.     Left Ear: External ear normal.     Nose: Nose normal.   Eyes:     General:        Right eye: No discharge.        Left eye: No discharge.     Conjunctiva/sclera: Conjunctivae normal.     Pupils: Pupils are equal, round, and reactive to light.   Neck:     Thyroid : No thyromegaly.     Vascular: No JVD.     Trachea: No tracheal deviation.   Cardiovascular:     Rate and Rhythm: Normal rate and regular rhythm.     Heart sounds: Normal heart sounds.  Pulmonary:     Effort: No respiratory distress.  Breath sounds: No stridor. No wheezing.  Abdominal:     General: Bowel sounds are normal. There is no distension.     Palpations: Abdomen is soft. There is no mass.     Tenderness: There is no abdominal tenderness. There is no guarding or rebound.   Musculoskeletal:        General: No tenderness.     Cervical back: Normal range of motion and neck supple. No rigidity.  Lymphadenopathy:     Cervical: No cervical adenopathy.   Skin:    Findings: No erythema or rash.   Neurological:     Cranial Nerves: No cranial nerve deficit.     Motor: No abnormal muscle tone.     Coordination: Coordination normal.     Deep Tendon Reflexes: Reflexes normal.   Psychiatric:        Behavior: Behavior normal.        Thought Content: Thought content normal.        Judgment: Judgment normal.     Lab Results  Component Value Date   WBC 6.1 07/11/2023   HGB 13.7 07/11/2023   HCT 40.7 07/11/2023   PLT 212.0 07/11/2023    GLUCOSE 102 (H) 07/11/2023   CHOL 156 08/31/2022   TRIG 79.0 08/31/2022   HDL 60.80 08/31/2022   LDLCALC 79 08/31/2022   ALT 19 07/11/2023   AST 20 07/11/2023   NA 137 07/11/2023   K 3.8 07/11/2023   CL 100 07/11/2023   CREATININE 0.73 07/11/2023   BUN 19 07/11/2023   CO2 32 07/11/2023   TSH 2.06 08/31/2022    US  THYROID  Result Date: 10/04/2022 CLINICAL DATA:  Goiter. EXAM: THYROID  ULTRASOUND TECHNIQUE: Ultrasound examination of the thyroid  gland and adjacent soft tissues was performed. COMPARISON:  None Available. FINDINGS: Parenchymal Echotexture: Normal Isthmus: 0.3 cm Right lobe: 5.7 x 2.2 x 3.7 cm Left lobe: 3.9 x 1.4 x 1.1 cm _________________________________________________________ Estimated total number of nodules >/= 1 cm: 2 Number of spongiform nodules >/=  2 cm not described below (TR1): 0 Number of mixed cystic and solid nodules >/= 1.5 cm not described below (TR2): 0 _________________________________________________________ Nodule # 1: Location: Right; Mid Maximum size: 5.2 cm; Other 2 dimensions: 3.0 x 2.2 cm Composition: spongiform (0) Echogenicity: anechoic (0) Shape: not taller-than-wide (0) Margins: smooth (0) Echogenic foci: none (0) ACR TI-RADS total points: 0. ACR TI-RADS risk category: TR1 (0-1 points). ACR TI-RADS recommendations: This nodule does NOT meet TI-RADS criteria for biopsy or dedicated follow-up. _________________________________________________________ Nodule # 2: Location: Right; Inferior Maximum size: 1.1 cm; Other 2 dimensions: 0.7 x 0.7 cm Composition: solid/almost completely solid (2) Echogenicity: isoechoic (1) Shape: not taller-than-wide (0) Margins: smooth (0) Echogenic foci: none (0) ACR TI-RADS total points: 3. ACR TI-RADS risk category: TR3 (3 points). ACR TI-RADS recommendations: Given size (<1.4 cm) and appearance, this nodule does NOT meet TI-RADS criteria for biopsy or dedicated follow-up. This area of nodularity is likely a pseudo nodule of normal  thyroid  tissue just inferior to the larger spongiform nodule. _________________________________________________________ No enlarged or abnormal appearing lymph nodes are identified. IMPRESSION: 1. Spongiform nodule in the right lobe occupying much of the right lobe and not meeting criteria for biopsy or dedicated follow-up. 2. 1.1 cm area of nodularity in the inferior right lobe is felt to likely represent a pseudo nodule of normal thyroid  tissue just inferior to the larger spongiform nodule. The above is in keeping with the ACR TI-RADS recommendations - J Am Coll Radiol 2017;14:587-595. Electronically Signed  By: Erica Hau M.D.   On: 10/04/2022 16:33    Assessment & Plan:   Problem List Items Addressed This Visit     Screening for colon cancer   Cologuard ordered      Relevant Orders   Cologuard   Anxiety disorder   Better Dating. Taking Lorazepam  <0.5 mg/d... D/c'd Lexapro , no on Viibrid per Dr Deborra Falter D/c Abilify  low dose (never took it)  Potential benefits of a long term benzodiazepines  use as well as potential risks  and complications were explained to the patient and were aknowledged. F/u w/Leanne        Relevant Medications   citalopram  (CELEXA ) 20 MG tablet   Abdominal cramps - Primary   Overall better.  Continue with anxiety management Use probiotics      Relevant Orders   Comprehensive metabolic panel with GFR (Completed)   CBC with Differential/Platelet (Completed)   Lipase (Completed)   Well adult exam      Meds ordered this encounter  Medications   predniSONE  (DELTASONE ) 20 MG tablet    Sig: TAKE 1 TABLET EVERY DAY AS NEEDED    Dispense:  30 tablet    Refill:  0   valACYclovir  (VALTREX ) 1000 MG tablet    Sig: Take 1 by mouth twice a day as needed    Dispense:  30 tablet    Refill:  0   DISCONTD: metroNIDAZOLE  (FLAGYL ) 500 MG tablet    Sig: Take 1 tablet (500 mg total) by mouth 3 (three) times daily.    Dispense:  30 tablet    Refill:  0    saccharomyces boulardii (FLORASTOR) 250 MG capsule    Sig: Take 1 capsule (250 mg total) by mouth 2 (two) times daily.    Dispense:  30 capsule    Refill:  1   lipase/protease/amylase (CREON ) 36000 UNITS CPEP capsule    Sig: Take 1 capsule (36,000 Units total) by mouth 3 (three) times daily before meals. Take with the first bite of food    Dispense:  90 capsule    Refill:  3      Follow-up: No follow-ups on file.  Anitra Barn, MD

## 2023-07-12 ENCOUNTER — Ambulatory Visit: Payer: Self-pay | Admitting: Internal Medicine

## 2023-07-13 ENCOUNTER — Telehealth: Payer: Self-pay

## 2023-07-13 ENCOUNTER — Ambulatory Visit (INDEPENDENT_AMBULATORY_CARE_PROVIDER_SITE_OTHER): Admitting: Psychology

## 2023-07-13 DIAGNOSIS — F32A Depression, unspecified: Secondary | ICD-10-CM

## 2023-07-13 DIAGNOSIS — F419 Anxiety disorder, unspecified: Secondary | ICD-10-CM

## 2023-07-13 NOTE — Telephone Encounter (Signed)
 Copied from CRM [id number]. Topic: Clinical - Prescription Issue >> Jul 13, 2023  2:29 PM Jenice Mitts wrote: Reason for CRM: Patient said her body is not handling the antibiotic well and she is wondering if there is anything else that can be prescribed

## 2023-07-13 NOTE — Progress Notes (Signed)
 [facility] Counselor/Therapist Progress Note  Patient ID: Megan Garner, MRN: [id number],    Date: 07/13/2023  Time Spent: 8:00am-9:05am  Pt is seen for an in person visit.  Treatment Type: Individual Therapy  Reported Symptoms: Pt reports sadness and grief related to recent break up and previous losses.    Mental Status Exam: Appearance:  Well Groomed     Behavior: Appropriate  Motor: Normal  Speech/Language:  Clear and Coherent  Affect: Tearful  Mood: sad  Thought process: normal  Thought content:   WNL  Sensory/Perceptual disturbances:   WNL  Orientation: oriented to person, place, time/date, and situation  Attention: Good  Concentration: Good  Memory: WNL  Fund of knowledge:  Good  Insight:   Good  Judgment:  Good  Impulse Control: Good   Risk Assessment: Danger to Self:  No Self-injurious Behavior: No Danger to Others: No Duty to Warn:no Physical Aggression / Violence:No  Access to Firearms a concern: No  Gang Involvement:No   Subjective: Counselor assessed pt current functioning per pt report.  Processed w/ pt emotions w/ recent separation and how bringing up grief from death of husband, and her father.   validated and normalized feelings.  Encouraged continued engagement w supports and establishing healthy boundaries in separation.  Pt wnl.  Pt reports difficult week w/ tearfulness, grieving separation and death of husband and dad.  Pt recognizes that this loss is bringing up those emotions.  Pt discussed less anxiety and felt some peace of mind that not her responsibility to make sure ex is ok.  Pt discussed moments that feel like clarity and her faith being present.  Pt reports a lot of positive support from friends and pastors.  Pt discussed boundaries she is becoming more aware of need to keep.    Interventions: Cognitive Behavioral Therapy, Solution-Oriented/Positive Psychology, and Boundaries  Diagnosis:Anxiety disorder, unspecified  type  Depression, unspecified depression type  Plan: Pt to f/u in 2 weeks w/ counseling.  Pt to f/u as scheduled w/ PCP.  Pt to f/u w/ Dr. Deborra Falter as scheduled.   Individualized Treatment Plan Strengths: enjoys travel, time w/ friends, walking dog, [place]  Pt working out consistently.   Supports: friends, good friend that lost her husband 6 months prior to her.  Pt is close to her family.     Goal/Needs for Treatment:  In order of importance to patient 1) decreased anxiety, irritability and depressed mood.  2) --- 3) ---    Client Statement of Needs: Sense of wellbeing.  Things I can do or not do to gain a sense of wellbeing.      Treatment Level:outpatient counseling  Symptoms:increased anxiety/ on edge, irritability, overwhelmed.  Pt depressed mood  Client Treatment Preferences:weekly to biweekly counseling.      Healthcare consumer's goal for treatment:   Counselor, Clydie Darter, [facility] will support the patient's ability to achieve the goals identified. Cognitive Behavioral Therapy, Assertive Communication/Conflict Resolution Training, Relaxation Training, ACT, Humanistic and other evidenced-based practices will be used to promote progress towards healthy functioning.    Healthcare consumer will: Actively participate in therapy, working towards healthy functioning.     *Justification for Continuation/Discontinuation of Goal: R=Revised, O=Ongoing, A=Achieved, D=Discontinued   Goal 1) decrease anxiety and depressive symptoms AEB pt report and therapist observation. Baseline date 10/12/22: Progress towards goal 0; How Often - Daily Target Date Goal Was reviewed Status Code Progress towards goal/Likert rating  10/12/23  Goal 2) Increased use of effective coping skills to manage stress and process grief per pt report of implementing.    Baseline date 10/12/22: Progress towards goal 0; How Often - Daily Target Date Goal Was reviewed Status Code  Progress towards goal  10/12/23                            This plan has been reviewed and created by the following participants:  This plan will be reviewed at least every 12 months. Date Behavioral Health Clinician Date Guardian/Patient   10/12/23  [facility] [name] [facility] 11/01/23 Verbal Consent Provided               Clydie Darter [facility]               [facility] [facility]

## 2023-07-15 ENCOUNTER — Encounter: Payer: Self-pay | Admitting: Internal Medicine

## 2023-07-15 ENCOUNTER — Other Ambulatory Visit: Payer: Self-pay | Admitting: Internal Medicine

## 2023-07-18 ENCOUNTER — Ambulatory Visit: Admitting: Psychology

## 2023-07-18 NOTE — Telephone Encounter (Signed)
 Being addressed in patient result note

## 2023-07-18 NOTE — Telephone Encounter (Signed)
 I am sorry you had side effects!  Do not take metronidazole .  Please take Florastor for 2 weeks one twice daily. Thank you,

## 2023-07-20 ENCOUNTER — Ambulatory Visit: Admitting: Psychology

## 2023-07-20 DIAGNOSIS — F419 Anxiety disorder, unspecified: Secondary | ICD-10-CM | POA: Diagnosis not present

## 2023-07-20 DIAGNOSIS — F32A Depression, unspecified: Secondary | ICD-10-CM

## 2023-07-20 NOTE — Progress Notes (Signed)
 [facility] Counselor/Therapist Progress Note  Patient ID: Megan Garner, MRN: [id number],    Date: 07/20/2023  Time Spent: 12:08pm-1:05pm  Pt is seen for an in person visit.  Treatment Type: Individual Therapy  Reported Symptoms: Pt reports sadness, grief and worry.    Mental Status Exam: Appearance:  Well Groomed     Behavior: Appropriate  Motor: Normal  Speech/Language:  Clear and Coherent  Affect: Tearful at times  Mood: sad  Thought process: normal  Thought content:   WNL  Sensory/Perceptual disturbances:   WNL  Orientation: oriented to person, place, time/date, and situation  Attention: Good  Concentration: Good  Memory: WNL  Fund of knowledge:  Good  Insight:   Good  Judgment:  Good  Impulse Control: Good   Risk Assessment: Danger to Self:  No Self-injurious Behavior: No Danger to Others: No Duty to Warn:no Physical Aggression / Violence:No  Access to Firearms a concern: No  Gang Involvement:No   Subjective: Counselor assessed pt current functioning per pt report.  Processed w/ pt emotions and added stressors. Reflected and validated grief and worry.  Discussed boundaries and identifying/expressing sadness that cannot change ex behaviors/decisions.  Explored ways ot seek support and recognizing strength from her faith.  Pt wnl congruent w/ report of sadness.  Pt reports she was struggling w/ continued side effects from medication and how impacted irritability as well.  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  Pt reflected on how has worked out for best and has been able to assert, reestablish professional boundaries.  Pt discussed boundaries maintaining w/ ex.  Pt reports hurt that sees how ex hurting and that his alcohol use is negatively impacting and grieving what could have been if alcoholism not present.  Pt plans for attending [place] meeting and maintaining positive  support engagement.    Interventions: Cognitive Behavioral Therapy, Solution-Oriented/Positive Psychology, and Boundaries  Diagnosis:Anxiety disorder, unspecified type  Depression, unspecified depression type  Plan: Pt to f/u in 2 weeks w/ counseling.  Pt to f/u as scheduled w/ PCP.  Pt to f/u w/ Dr. Deborra Falter as scheduled.   Individualized Treatment Plan Strengths: enjoys travel, time w/ friends, walking dog, [place].  Pt working out consistently.   Supports: friends, good friend that lost her husband 6 months prior to her.  Pt is close to her family.     Goal/Needs for Treatment:  In order of importance to patient 1) decreased anxiety, irritability and depressed mood.  2) --- 3) ---    Client Statement of Needs: Sense of wellbeing.  Things I can do or not do to gain a sense of wellbeing.      Treatment Level:outpatient counseling  Symptoms:increased anxiety/ on edge, irritability, overwhelmed.  Pt depressed mood  Client Treatment Preferences:weekly to biweekly counseling.      Healthcare consumer's goal for treatment:   Counselor, Clydie Darter, [facility] will support the patient's ability to achieve the goals identified. Cognitive Behavioral Therapy, Assertive Communication/Conflict Resolution Training, Relaxation Training, ACT, Humanistic and other evidenced-based practices will be used to promote progress towards healthy functioning.    Healthcare consumer will: Actively participate in therapy, working towards healthy functioning.     *Justification for Continuation/Discontinuation of Goal: R=Revised, O=Ongoing, A=Achieved, D=Discontinued   Goal 1) decrease anxiety and depressive symptoms AEB pt report and therapist observation. Baseline date 10/12/22: Progress towards goal 0; How Often - Daily Target Date Goal  Was reviewed Status Code Progress towards goal/Likert rating  10/12/23                            Goal 2) Increased use of effective coping skills to manage stress  and process grief per pt report of implementing.    Baseline date 10/12/22: Progress towards goal 0; How Often - Daily Target Date Goal Was reviewed Status Code Progress towards goal  10/12/23                            This plan has been reviewed and created by the following participants:  This plan will be reviewed at least every 12 months. Date Behavioral Health Clinician Date Guardian/Patient   10/12/23  [facility] [name] [facility] 11/01/23 Verbal Consent Provided                Clydie Darter LCMHC

## 2023-07-23 ENCOUNTER — Encounter: Payer: Self-pay | Admitting: Internal Medicine

## 2023-07-23 DIAGNOSIS — Z0000 Encounter for general adult medical examination without abnormal findings: Secondary | ICD-10-CM | POA: Insufficient documentation

## 2023-07-23 NOTE — Assessment & Plan Note (Signed)
 Overall better.  Continue with anxiety management Use probiotics

## 2023-07-23 NOTE — Assessment & Plan Note (Signed)
 Cologuard ordered

## 2023-07-23 NOTE — Assessment & Plan Note (Signed)
 Better Dating. Taking Lorazepam <0.5 mg/d... D/c'd Lexapro, no on Viibrid per Dr Evelene Croon D/c Abilify low dose (never took it)  Potential benefits of a long term benzodiazepines  use as well as potential risks  and complications were explained to the patient and were aknowledged. F/u w/Leanne

## 2023-08-01 ENCOUNTER — Ambulatory Visit: Admitting: Psychology

## 2023-08-02 DIAGNOSIS — M9903 Segmental and somatic dysfunction of lumbar region: Secondary | ICD-10-CM | POA: Diagnosis not present

## 2023-08-02 DIAGNOSIS — M51372 Other intervertebral disc degeneration, lumbosacral region with discogenic back pain and lower extremity pain: Secondary | ICD-10-CM | POA: Diagnosis not present

## 2023-08-07 ENCOUNTER — Other Ambulatory Visit: Payer: Self-pay | Admitting: Internal Medicine

## 2023-08-10 DIAGNOSIS — R109 Unspecified abdominal pain: Secondary | ICD-10-CM | POA: Diagnosis not present

## 2023-08-10 DIAGNOSIS — R112 Nausea with vomiting, unspecified: Secondary | ICD-10-CM | POA: Diagnosis not present

## 2023-08-10 DIAGNOSIS — R197 Diarrhea, unspecified: Secondary | ICD-10-CM | POA: Diagnosis not present

## 2023-08-10 DIAGNOSIS — R140 Abdominal distension (gaseous): Secondary | ICD-10-CM | POA: Diagnosis not present

## 2023-08-10 DIAGNOSIS — Z800 Family history of malignant neoplasm of digestive organs: Secondary | ICD-10-CM | POA: Diagnosis not present

## 2023-08-10 LAB — LAB REPORT - SCANNED

## 2023-08-11 ENCOUNTER — Telehealth: Admitting: Physician Assistant

## 2023-08-11 DIAGNOSIS — R198 Other specified symptoms and signs involving the digestive system and abdomen: Secondary | ICD-10-CM

## 2023-08-11 NOTE — Progress Notes (Signed)
 Patient needing labs for a stool culture ordered. Advised we are unable to place lab orders and advised to be seen in person tomorrow at a local Urgent Care facility where labs can be ordered. She voiced understanding.

## 2023-08-12 ENCOUNTER — Inpatient Hospital Stay
Admission: RE | Admit: 2023-08-12 | Discharge: 2023-08-12 | Discharge status: Home / Self Care | Source: Ambulatory Visit | Attending: Family Medicine

## 2023-08-12 VITALS — BP 154/106 | HR 95 | Temp 97.6°F | Resp 20

## 2023-08-12 DIAGNOSIS — R070 Pain in throat: Secondary | ICD-10-CM

## 2023-08-12 NOTE — ED Triage Notes (Addendum)
 Pt was notified upon arrival that our PA will not be ordering any stool tests on the stool sample she presents to UC with-pt states she would like to be seen for c/o dry and scratchy throat that started yesterday and requesting a throat swab for yeast-NAD-steady gait

## 2023-08-12 NOTE — ED Provider Notes (Signed)
 [facility]  Note:  This document was prepared using Conservation officer, historic buildings and may include unintentional dictation errors.  MRN: [id number] DOB: 1960/02/21  Subjective:   Megan Garner is a 63 y.o. female presenting for request for a stool test.  Patient presents with a stool specimen and plastic Tupperware.  She states she has concerns about yeast infection.  Would like to be swabbed and tested for yeast of her throat.  Reports throat discomfort.  She does see a GI specialist and has an appointment coming up.  Does not have any particular orders for stool testing or oral swabs.  No current facility-administered medications for this encounter.  Current Outpatient Medications:    Cholecalciferol (VITAMIN D3) 50 MCG (2000 UT) capsule, Take 1 capsule (2,000 Units total) by mouth daily., Disp: 100 capsule, Rfl: 3   citalopram  (CELEXA ) 20 MG tablet, Take 1 tablet by mouth daily., Disp: , Rfl:    lipase/protease/amylase (CREON ) 36000 UNITS CPEP capsule, Take 1 capsule (36,000 Units total) by mouth 3 (three) times daily before meals. Take with the first bite of food, Disp: 90 capsule, Rfl: 3   LORazepam  (ATIVAN ) 1 MG tablet, TAKE 1/2 TO 1 TABLET BY MOUTH TWICE A DAY AS NEEDED FOR ANXIETY/SLEEP, Disp: 60 tablet, Rfl: 2   Magnesium 100 MG CAPS, Take 100 mg by mouth daily., Disp: , Rfl:    misoprostol (CYTOTEC) 200 MCG tablet, 200 mcg., Disp: , Rfl:    Omega-3 Fatty Acids (FISH OIL) 1000 MG CAPS, Take 1 each by mouth daily.  , Disp: , Rfl:    predniSONE  (DELTASONE ) 20 MG tablet, TAKE 1 TABLET EVERY DAY AS NEEDED, Disp: 30 tablet, Rfl: 0   rosuvastatin  (CRESTOR ) 5 MG tablet, TAKE 1 TABLET BY MOUTH DAILY, Disp: 90 tablet, Rfl: 3   saccharomyces boulardii (FLORASTOR) 250 MG capsule, Take 1 capsule (250 mg total) by mouth 2 (two) times daily., Disp: 30 capsule, Rfl: 1   valACYclovir  (VALTREX ) 1000 MG tablet, Take 1 by mouth twice a day as needed, Disp: 30 tablet, Rfl: 0    Vilazodone HCl (VIIBRYD) 40 MG TABS, Take 40 mg by mouth daily., Disp: , Rfl:    Allergies  Allergen Reactions   Metronidazole      Nausea, upset stomach    Past Medical History:  Diagnosis Date   Allergy    cyclical cough   Asthma    Melanoma in situ [facility])      Past Surgical History:  Procedure Laterality Date   FACIAL COSMETIC SURGERY      Family History  Problem Relation Age of Onset   Hypertension Mother    Arthritis Mother        gout   Heart disease Mother 89       STENT   Cancer Father 59       colon ca   Hypertension Other     Social History   Tobacco Use   Smoking status: Never   Smokeless tobacco: Never  Vaping Use   Vaping status: Never Used  Substance Use Topics   Alcohol use: Yes    Comment: weekly   Drug use: Yes    Comment: THC edible    ROS   Objective:   Vitals: BP (!) 154/106 (BP Location: Right Arm)   Pulse 95   Temp 97.6 F (36.4 C) (Oral)   Resp 20   LMP 03/24/2011   SpO2 96%   Physical Exam Constitutional:  General: She is not in acute distress.    Appearance: Normal appearance. She is well-developed. She is not ill-appearing, toxic-appearing or diaphoretic.  HENT:     Head: Normocephalic and atraumatic.     Right Ear: External ear normal.     Left Ear: External ear normal.     Nose: Nose normal.     Mouth/Throat:     Mouth: Mucous membranes are moist.     Pharynx: No pharyngeal swelling, oropharyngeal exudate, posterior oropharyngeal erythema or uvula swelling.     Tonsils: No tonsillar exudate or tonsillar abscesses. 0 on the right. 0 on the left.     Comments: Thick wall of postnasal drainage overlying pharynx. Eyes:     General: No scleral icterus.       Right eye: No discharge.        Left eye: No discharge.     Extraocular Movements: Extraocular movements intact.  Cardiovascular:     Rate and Rhythm: Normal rate.  Pulmonary:     Effort: Pulmonary effort is normal.  Skin:    General: Skin is warm and  dry.  Neurological:     General: No focal deficit present.     Mental Status: She is alert and oriented to person, place, and time.  Psychiatric:        Mood and Affect: Mood normal.        Behavior: Behavior normal.     Assessment and Plan :   PDMP not reviewed this encounter.  1. Throat discomfort    I advised patient that we cannot accept any kind of specimen that she is bringing in plastic Tupperware.  I recommended continued follow-up with her GI specialist as she has an appointment this upcoming week.  Secondly, I addressed patient's concern for yeast infection which she suspects is throughout her GI tract.  I informed patient that there is no swab that can confirm yeast infection of the throat.  I offered strep testing but patient refused.  I informed her that her physical exam findings are not consistent with oropharyngeal candidiasis.  I did explain the patient that she has significant postnasal drainage of her throat that is likely the source of her throat discomfort.  Patient refused further explanation and insisted I provide her with [facility] locations so that she can take her stool sample and pay cash for testing.  I informed her that this would be inappropriate as there is no order for such testing and they would not accept her specimen.  Patient left abruptly thereafter.   Christopher Savannah, [place] 08/12/23 [phone]

## 2023-08-17 ENCOUNTER — Ambulatory Visit: Admitting: Psychology

## 2023-08-17 DIAGNOSIS — F419 Anxiety disorder, unspecified: Secondary | ICD-10-CM

## 2023-08-17 DIAGNOSIS — F32A Depression, unspecified: Secondary | ICD-10-CM | POA: Diagnosis not present

## 2023-08-17 NOTE — Progress Notes (Signed)
 [facility] Counselor/Therapist Progress Note  Patient ID: Megan Garner, MRN: [id number],    Date: 08/17/2023  Time Spent: 1:30pm-2:28pm  Pt is seen for a virtual video visit via caregility.  Pt joins from her home, reporting privacy, and counselor from her home office.  Pt consents to virtual visit and is aware of limitations of such visits.    Treatment Type: Individual Therapy  Reported Symptoms: Pt reports some sadness and worry for ex.      Mental Status Exam: Appearance:  Well Groomed     Behavior: Appropriate  Motor: Normal  Speech/Language:  Clear and Coherent  Affect: Appropriate   Mood: sad  Thought process: normal  Thought content:   WNL  Sensory/Perceptual disturbances:   WNL  Orientation: oriented to person, place, time/date, and situation  Attention: Good  Concentration: Good  Memory: WNL  Fund of knowledge:  Good  Insight:   Good  Judgment:  Good  Impulse Control: Good   Risk Assessment: Danger to Self:  No Self-injurious Behavior: No Danger to Others: No Duty to Warn:no Physical Aggression / Violence:No  Access to Firearms a concern: No  Gang Involvement:No   Subjective: Counselor assessed pt current functioning per pt report.  Processed w/ pt emotions and interactions w/ ex.  Reflected and validated sadness and worry for ex and decline.  Explored w/pt her boundaries and her self care in interactions.  Discussed recent visit w/ family and support from.  Encouraged continued self care strategies.  Pt affect wnl.  Pt asked for a virtual visit due to stomach upset.  Pt reports that she is being tx for yeast overgrowth in her gut.  Pt discussed impact this has had past couple weeks on functioning.  Pt discussed positive of her birthday and time w/ friends.  Pt reports did have lunch w/ ex and first face to face contact.  Pt reports that was sad to see his cognitive decline that has worsened to her observation.  Pt discussed grieving loss for person she  care for.  Pt also acknowledged need to keep her boundaries for her self care.  Pt discussed ways to do and upcoming plans for concert together that made in past and to do w/ others.  Pt reports on visit w/ her family and positives of support.  Pt also discussed learned of nephews mental health issues and how supportive conversation together.     Interventions: Cognitive Behavioral Therapy, Solution-Oriented/Positive Psychology, and Boundaries  Diagnosis:Anxiety disorder, unspecified type  Depression, unspecified depression type  Plan: Pt to f/u in 2 weeks w/ counseling.  Pt to f/u as scheduled w/ PCP.  Pt to f/u w/ Dr. Vincente as scheduled.   Individualized Treatment Plan Strengths: enjoys travel, time w/ friends, walking dog, [place].  Pt working out consistently.   Supports: friends, good friend that lost her husband 6 months prior to her.  Pt is close to her family.     Goal/Needs for Treatment:  In order of importance to patient 1) decreased anxiety, irritability and depressed mood.  2) --- 3) ---    Client Statement of Needs: Sense of wellbeing.  Things I can do or not do to gain a sense of wellbeing.      Treatment Level:outpatient counseling  Symptoms:increased anxiety/ on edge, irritability, overwhelmed.  Pt depressed mood  Client Treatment Preferences:weekly to biweekly counseling.      Healthcare consumer's goal for treatment:   Counselor, Damien Herald, [facility] will support the patient's  ability to achieve the goals identified. Cognitive Behavioral Therapy, Assertive Communication/Conflict Resolution Training, Relaxation Training, ACT, Humanistic and other evidenced-based practices will be used to promote progress towards healthy functioning.    Healthcare consumer will: Actively participate in therapy, working towards healthy functioning.     *Justification for Continuation/Discontinuation of Goal: R=Revised, O=Ongoing, A=Achieved, D=Discontinued   Goal 1)  decrease anxiety and depressive symptoms AEB pt report and therapist observation. Baseline date 10/12/22: Progress towards goal 0; How Often - Daily Target Date Goal Was reviewed Status Code Progress towards goal/Likert rating  10/12/23                            Goal 2) Increased use of effective coping skills to manage stress and process grief per pt report of implementing.    Baseline date 10/12/22: Progress towards goal 0; How Often - Daily Target Date Goal Was reviewed Status Code Progress towards goal  10/12/23                            This plan has been reviewed and created by the following participants:  This plan will be reviewed at least every 12 months. Date Behavioral Health Clinician Date Guardian/Patient   10/12/23  [facility] Barbarann [facility] 11/01/23 Verbal Consent Provided               BARBARANN APPL LCMHC

## 2023-08-24 DIAGNOSIS — Z8719 Personal history of other diseases of the digestive system: Secondary | ICD-10-CM | POA: Diagnosis not present

## 2023-08-24 DIAGNOSIS — E871 Hypo-osmolality and hyponatremia: Secondary | ICD-10-CM | POA: Diagnosis not present

## 2023-08-24 DIAGNOSIS — K449 Diaphragmatic hernia without obstruction or gangrene: Secondary | ICD-10-CM | POA: Diagnosis not present

## 2023-08-24 DIAGNOSIS — R1084 Generalized abdominal pain: Secondary | ICD-10-CM | POA: Diagnosis not present

## 2023-08-24 DIAGNOSIS — R42 Dizziness and giddiness: Secondary | ICD-10-CM | POA: Diagnosis not present

## 2023-08-24 DIAGNOSIS — R109 Unspecified abdominal pain: Secondary | ICD-10-CM | POA: Diagnosis not present

## 2023-08-24 DIAGNOSIS — Z800 Family history of malignant neoplasm of digestive organs: Secondary | ICD-10-CM | POA: Diagnosis not present

## 2023-08-24 DIAGNOSIS — R110 Nausea: Secondary | ICD-10-CM | POA: Diagnosis not present

## 2023-08-27 ENCOUNTER — Encounter: Payer: Self-pay | Admitting: Internal Medicine

## 2023-08-30 ENCOUNTER — Ambulatory Visit (INDEPENDENT_AMBULATORY_CARE_PROVIDER_SITE_OTHER): Admitting: Psychology

## 2023-08-30 DIAGNOSIS — F419 Anxiety disorder, unspecified: Secondary | ICD-10-CM | POA: Diagnosis not present

## 2023-08-30 DIAGNOSIS — F32A Depression, unspecified: Secondary | ICD-10-CM

## 2023-08-30 NOTE — Progress Notes (Signed)
 [facility] Counselor/Therapist Progress Note  Patient ID: Megan Garner, MRN: [id number],    Date: 08/30/2023  Time Spent: 1:32pm-2:28pm  Pt is seen for a virtual video visit via caregility.  Pt joins from her home, reporting privacy, and counselor from her home office.  Pt consents to virtual visit and is aware of limitations of such visits.    Treatment Type: Individual Therapy  Reported Symptoms: Pt reports more clarity and boundaries established.    Mental Status Exam: Appearance:  Well Groomed     Behavior: Appropriate  Motor: Normal  Speech/Language:  Clear and Coherent  Affect: Appropriate   Mood: anxious  Thought process: normal  Thought content:   WNL  Sensory/Perceptual disturbances:   WNL  Orientation: oriented to person, place, time/date, and situation  Attention: Good  Concentration: Good  Memory: WNL  Fund of knowledge:  Good  Insight:   Good  Judgment:  Good  Impulse Control: Good   Risk Assessment: Danger to Self:  No Self-injurious Behavior: No Danger to Others: No Duty to Warn:no Physical Aggression / Violence:No  Access to Firearms a concern: No  Gang Involvement:No   Subjective: Counselor assessed pt current functioning per pt report.  Processed w/ pt recent health stressors and how impacting.  Discussed recent interactions with ex and increased clarity for next steps w/ boundaries. Explored boundaries setting and natural supports present.  Pt affect wnl.  Pt reports she had another ED visit related to gastro issues.  Pt reports she is working w/ functioning medicine and progress being made.  [place] and interactions during.  Pt was able to receive clarity with how interactions impacted and stress of and boundaries she has communicated with him.  Pt reports she has the support of her pastor and his wife and a step brother.  Pt feels positive as both have experienced love ones w/ addiction and  can be a support to her in maintain healthy boundaries.  Pt would like to pause counseling at this time and utilize her natural supports.  Pt reports will reach out if further counseling needed.      Interventions: Cognitive Behavioral Therapy, Solution-Oriented/Positive Psychology, and Boundaries  Diagnosis:Anxiety disorder, unspecified type  Depression, unspecified depression type  Plan: Pt is not scheduled for any follow up at this time. Pt to utilize natural supports. Pt to f/u as scheduled w/Dr. Vincente and appointments w/ providers currently tx for gastro issues.    Individualized Treatment Plan Strengths: enjoys travel, time w/ friends, walking dog, [place] time [place].  Pt working out consistently.   Supports: friends, good friend that lost her husband 6 months prior to her.  Pt is close to her family.     Goal/Needs for Treatment:  In order of importance to patient 1) decreased anxiety, irritability and depressed mood.  2) --- 3) ---    Client Statement of Needs: Sense of wellbeing.  Things I can do or not do to gain a sense of wellbeing.      Treatment Level:outpatient counseling  Symptoms:increased anxiety/ on edge, irritability, overwhelmed.  Pt depressed mood  Client Treatment Preferences:weekly to biweekly counseling.      Healthcare consumer's goal for treatment:   Counselor, Damien Herald, [facility] will support the patient's ability to achieve the goals identified. Cognitive Behavioral Therapy, Assertive Communication/Conflict Resolution Training, Relaxation Training, ACT, Humanistic and other evidenced-based practices will be used to promote progress towards healthy functioning.  Healthcare consumer will: Actively participate in therapy, working towards healthy functioning.     *Justification for Continuation/Discontinuation of Goal: R=Revised, O=Ongoing, A=Achieved, D=Discontinued   Goal 1) decrease anxiety and depressive symptoms AEB pt report and therapist  observation. Baseline date 10/12/22: Progress towards goal 0; How Often - Daily Target Date Goal Was reviewed Status Code Progress towards goal/Likert rating  10/12/23                            Goal 2) Increased use of effective coping skills to manage stress and process grief per pt report of implementing.    Baseline date 10/12/22: Progress towards goal 0; How Often - Daily Target Date Goal Was reviewed Status Code Progress towards goal  10/12/23                            This plan has been reviewed and created by the following participants:  This plan will be reviewed at least every 12 months. Date Behavioral Health Clinician Date Guardian/Patient   10/12/23  [facility] Barbarann [facility] 11/01/23 Verbal Consent Provided                BARBARANN APPL LCMHC

## 2023-09-07 DIAGNOSIS — M9903 Segmental and somatic dysfunction of lumbar region: Secondary | ICD-10-CM | POA: Diagnosis not present

## 2023-11-09 NOTE — Anesthesia Postprocedure Evaluation (Signed)
 Patient: [name]  Procedure Summary     Date: 11/09/23 Room / Location: [facility] - OUTPATIENT ENDOSCOPY [facility]   Anesthesia Start: 1352 Anesthesia Stop: 1421   Procedures:      ESOPHAGOGASTRODUODENOSCOPY     COLONOSCOPY Diagnosis:      Abdominal pain, unspecified abdominal location     Subepithelial gastric mass     Family history of colon cancer   Scheduled Providers: Ronal Karna Ferraris, MD Responsible Provider: Carlin Toribio Carson, MD   Anesthesia Type: general ASA Status: 2       Anesthesia Type: general  Vitals Value Taken Time  BP 134/62 11/09/23 14:46  Temp 97.2 F (36.2 C) 11/09/23 14:20  Pulse 86 11/09/23 14:48  Resp 18 11/09/23 14:48  SpO2 100 % 11/09/23 14:47  Vitals shown include unfiled device data.  There were no known notable events for this encounter.  Anesthesia Post Evaluation  Final anesthesia type: general Patient location during evaluation: GI Patient participation: complete - patient participated Level of consciousness: awake and alert Pain score: 0 Pain management: adequate Post-op nausea and vomiting?: none Post-op vital signs: post-procedure vital signs are stable Patient temperature: Normothermic Cardiovascular status: acceptable and hemodynamically stable Respiratory status: acceptable Hydration status: acceptable Post-op disposition: Home Anesthesia post-op complications?:no complications

## 2023-11-09 NOTE — H&P (Signed)
 Gastroenterology Preprocedural History and Physical     Chief Complaint/Reason for Procedure: Megan Garner is a 62 y.o. female scheduled for a EGD&Colonoscopy, for the following indication: Abdominal Pain, Gastric Mass, Family History of Colon Cancer,  using deep sedation with propofol  or general anesthesia as per anesthesia provider .  A History and Physical has been performed and patient medication allergies have been reviewed. The patient's tolerance of previous anesthesia has been reviewed. The risks and benefits of the procedure and the sedation options and risks were discussed with the patient. All questions were answered and informed consent obtained.  HPI  Medical History[1]  Surgical History[2]  Family History[3]  Social History   Socioeconomic History  . Marital status: Widowed    Spouse name: Not on file  . Number of children: Not on file  . Years of education: Not on file  . Highest education level: Not on file  Occupational History  . Not on file  Tobacco Use  . Smoking status: Never  . Smokeless tobacco: Never  Substance and Sexual Activity  . Alcohol use: Yes    Alcohol/week: 2.0 standard drinks of alcohol  . Drug use: Never  . Sexual activity: Not on file  Other Topics Concern  . Not on file  Social History Narrative  . Not on file   Social Drivers of Health   Food Insecurity: Low Risk  (11/08/2023)   Food vital sign   . Within the past 12 months, you worried that your food would run out before you got money to buy more: Never true   . Within the past 12 months, the food you bought just didn't last and you didn't have money to get more: Never true  Transportation Needs: No Transportation Needs (11/08/2023)   Transportation   . In the past 12 months, has lack of reliable transportation kept you from medical appointments, meetings, work or from getting things needed for daily living? : No  Safety: Not on file  Living Situation: Medium Risk (11/08/2023)    Living Situation   . What is your living situation today?: I have a steady place to live   . Think about the place you live. Do you have problems with any of the following? Choose all that apply:: Lead paint or pipes    Current Rx ordered in [facility][4]  Allergies[5]    Physical Exam:  There were no vitals filed for this visit. There is no height or weight on file to calculate BMI.  Airway:  MALLAMPATI TWO   Heart:  normal S1 and S2 Lungs:  clear Abdomen:  soft, nontender, normal bowel sounds Mental Status:  awake and alert; oriented to person, place, and time     ASA Grade Assessment: ASA 2 - Patient with mild systemic disease with no functional limitations   I have reviewed patient's health history and patient is cleared to proceed with the proposed procedure at this facility.   Ronal JONETTA Ferraris, MD       [1] Past Medical History: Diagnosis Date  . Anxiety   . Family history of colon cancer in father   . Gastritis   [2] Past Surgical History: Procedure Laterality Date  . COLONOSCOPY  1998, 2003, 2008, 2013, 2020   Procedure: COLONOSCOPY  . ESOPHAGOGASTRODUODENOSCOPY  2008, 2020   Procedure: ESOPHAGOGASTRODUODENOSCOPY  . OTHER SURGICAL HISTORY  1985   Procedure: OTHER SURGICAL HISTORY (Melanoma resection)  [3] Family History Problem Relation Name Age of Onset  . Heart disease  Mother    . Rectal cancer Father  79  . Amblyopia Neg Hx    . Blindness Neg Hx    . Cancer Neg Hx    . Diabetes Neg Hx    . Cataracts Neg Hx    . Glaucoma Neg Hx    . Hypertension Neg Hx    . Macular degeneration Neg Hx    . Retinal detachment Neg Hx    . Strabismus Neg Hx    . Stroke Neg Hx    . Thyroid  disease Neg Hx    [4] Meds Ordered in [facility]  Medication Sig Dispense Refill  . cholecalciferol 25 mcg (1,000 unit) cap Take  by mouth.    . citalopram  (CeleXA ) 20 mg tablet Take 1 tablet by mouth once a day    . omega-3 acid ethyl esters (LOVAZA) 1 gram capsule Take 1 each  by mouth.    . turmeric 400 mg cap Take  by mouth.    . valACYclovir  (VALTREX ) 1 gram tablet TAKE 2 TABLETS BY MOUTH AT ONSET, AND REPEAT IN 12 HOURS AS NEEDED  2   Current Facility-Administered Medications Ordered in [facility]  Medication Dose Route Frequency Provider Last Rate Last Admin  . lactated ringer's infusion 30 mL  30 mL intravenous Continuous Ronal Karna Ferraris, MD      [5] Allergies Allergen Reactions  . Flagyl  [Metronidazole ]

## 2023-11-09 NOTE — Anesthesia Preprocedure Evaluation (Signed)
 Patient: Megan Garner  Procedure Information     Date/Time: 11/09/23 1330   Scheduled providers: Ronal Karna Ferraris, MD   Procedures:      ESOPHAGOGASTRODUODENOSCOPY     COLONOSCOPY   Location: [facility] - OUTPATIENT ENDOSCOPY [facility]       Relevant Problems  RESPIRATORY SYSTEM  (+) Asthma    There were no vitals taken for this visit.   Clinical information reviewed:  Allergies  Meds  Surg Hx      Anesthesia Evaluation  Anesthesia History: Patient has no history of anesthetic complications.  PONV Predictive Score (Scale 0-5): (+) Female Apfel risk score: 1 Exercise Tolerance: good Respiratory: Patient has no asthma.  Cardiovascular: negative cardio ROS.  HEENT: negative HEENT ROS.  Neurological: negative neuro ROS.  Musculoskeletal: negative musculoskeletal ROS.  Renal/Gastrointestinal: Patient has no vomiting and no bowel obstruction.  Genitourinary: negative renal ROS.  Endocrine: negative endocrine ROS.  Hematology/Oncology: negative hematology/oncology ROS.     Physical Exam  Airway  Mallampati: II TM Distance (FB): 4 Oral Aperture (FB): 4 Neck ROM: full ROM Cardiovascular  Rhythm: regular Rate: normal Dental - normal exam Pulmonary - normal exam HEENT - normal exam Neurological  Neurological: alert and alert and oriented to person, place, time, and situation Functional Status  Exercise Tolerance: >4 METS   Anesthesia Plan  Review Preop documentation reviewed: H&P reviewed, Surgeon's Note Reviewed, NPO Status Reviewed, Preop Vitals Reviewed, Previous Anesthesia Records Reviewed and Periop Tests and Results Reviewed Comments: ATTENDING ATTESTATION I have perfomed my preanesthesia assessment and reevaluated this patient immediately prior to this procedure. I have  reviewed the patient's medical,  surgical, anesthesic history and confirmed surgical site and  blood product availability as it applies. I have  reviewed  current medication(s), drug allergies, lab results, ancillary studies and consultations within the EMR.  Medication guidelines appropriate for surgical urgency have been followed. I have confirmed NPO status as applicable to the relative urgency of this case.   I have confirmed the ASA status of this patient. Anesthesia risks, benefits and alternatives have been discussed with this  patient or the patient's legal representative.   I have discussed the anesthetic plan with the Anesthesia Resident and/or CRNA and/or SRNA  prior to initiating this anesthetic.   Plan ASA score: 2  Anesthesia type: general Induction: intravenous Post-op plan: PACU  Informed Consent Anesthetic plan and risks discussed with patient. Use of blood products discussed with patient whom consented to blood products. Plan discussed with CRNA.   Date of Last Liquid: 11/09/23 Time of last liquid: 1020 Date of Last Solid: 11/08/23 Time of last solid: 0300

## 2023-11-13 NOTE — Progress Notes (Signed)
 Subjective Megan Garner is a 63 y.o. female who presents for No chief complaint on file. History of Present Illness The patient presents for autonomic nervous system problems and thought we were still an integrative medicine practice.  She reports experiencing issues with her autonomic nervous system, including significant circadian rhythm disturbances and a persistent loss of taste. She recently returned from [place], where she spent 11 days without any sense of taste. Stress related to her husband's health has exacerbated her autonomic nervous system issues. She is planning a scuba diving trip to the [place] in a week and expresses fear of potential panic attacks. She has been seeking advice from Dr. Greig, but due to personal differences, she is now looking for another integrative medicine practitioner. She recalls an incident where the removal of polyps from her abdomen by Dr. Reena on Thursday triggered a severe reaction in her autonomic nervous system. Her gastroenterology group advised her to use Ativan  liberally during this period. She reports intolerance to hunger, thirst, and heat. She is not suicidal. She has been undergoing regular colonoscopies due to her father's history of colon cancer. She has had two ER visits due to high histamine levels and an overactive amygdala, which caused her blood pressure to spike. She has tried acupuncture, which she found beneficial, and plans to continue with it. She also mentions that her PCP misdiagnosed her grief as depression and prescribed Lexapro  and an antipsychotic. She believes she was overtreated for a minor bacterial infection after a trip to [place].  Hobbies: Scuba diving Sleep: Reports significant circadian rhythm disturbances Living Condition: Lives in [place]  PAST SURGICAL HISTORY: - Removal of polyps from abdomen on Thursday - Regular colonoscopies due to family history of colon cancer  FAMILY HISTORY Her father died of colon  cancer.  Review of Systems: 14 point review of systems obtained and are negative except for history of present illness.   Objective  There were no vitals taken for this visit.  Physical Exam Constitutional:      Appearance: Normal appearance.  Neurological:     Mental Status: She is alert.  Psychiatric:        Mood and Affect: Mood normal.        Behavior: Behavior normal.        Thought Content: Thought content normal.        Judgment: Judgment normal.     Assessment & Plan 1. Autonomic nervous system dysfunction: - Significant circadian rhythm problems and lack of taste have been reported, attributed to autonomic nervous system issues. Symptoms have persisted for the past 6 months and have been exacerbated by recent stressors. - She specifically desires integrative medicine approach and requests referral. - A referral to integrative medicine will be made to address her autonomic nervous system issues and provide a holistic approach to her treatment.  Diagnoses and all orders for this visit:  Vagus nerve disorder -     Ambulatory referral to Integrative Medicine; Future  Other orders -     magnesium oxide 400 mg tab tablet; Take 400 mg by mouth. -     lactobacillus combination no.4 (Probiotic) 3 billion cell cap -     famotidine (PEPCID) 10 mg tablet; Take 10 mg by mouth 2 (two) times a day. -     saccharomyces boulardii (Florastor) 250 mg capsule; Take 250 mg by mouth. -     vilazodone (VIIBRYD) 40 mg tab; Take 40 mg by mouth daily. -     progesterone (  PROMETRIUM) 100 mg cap capsule; Take 100 mg by mouth.    Electronically signed: Recardo VEAR Sierras, PA-C 11/13/2023  10:25 AM

## 2023-12-06 ENCOUNTER — Other Ambulatory Visit: Payer: Self-pay | Admitting: Medical Genetics

## 2023-12-06 DIAGNOSIS — Z006 Encounter for examination for normal comparison and control in clinical research program: Secondary | ICD-10-CM

## 2024-03-14 ENCOUNTER — Ambulatory Visit (HOSPITAL_COMMUNITY)
Admission: EM | Admit: 2024-03-14 | Discharge: 2024-03-14 | Disposition: A | Discharge status: Home / Self Care | Source: Home / Self Care | Attending: Family Medicine | Admitting: Family Medicine

## 2024-03-14 ENCOUNTER — Encounter (HOSPITAL_COMMUNITY): Payer: Self-pay | Admitting: Emergency Medicine

## 2024-03-14 ENCOUNTER — Ambulatory Visit (HOSPITAL_COMMUNITY): Payer: Self-pay | Admitting: Family Medicine

## 2024-03-14 DIAGNOSIS — R030 Elevated blood-pressure reading, without diagnosis of hypertension: Secondary | ICD-10-CM

## 2024-03-14 LAB — COMPREHENSIVE METABOLIC PANEL WITH GFR
ALT: 22 U/L (ref 0–44)
AST: 28 U/L (ref 15–41)
Albumin: 4.6 g/dL (ref 3.5–5.0)
Alkaline Phosphatase: 86 U/L (ref 38–126)
Anion gap: 13 (ref 5–15)
BUN: 11 mg/dL (ref 8–23)
CO2: 26 mmol/L (ref 22–32)
Calcium: 9.8 mg/dL (ref 8.9–10.3)
Chloride: 93 mmol/L — ABNORMAL LOW (ref 98–111)
Creatinine, Ser: 0.68 mg/dL (ref 0.44–1.00)
GFR, Estimated: >60 mL/min
Glucose, Bld: 95 mg/dL (ref 70–99)
Potassium: 3.7 mmol/L (ref 3.5–5.1)
Sodium: 132 mmol/L — ABNORMAL LOW (ref 135–145)
Total Bilirubin: 0.5 mg/dL (ref 0.0–1.2)
Total Protein: 7.6 g/dL (ref 6.5–8.1)

## 2024-03-14 LAB — CBC
HCT: 41.9 % (ref 36.0–46.0)
Hemoglobin: 14.4 g/dL (ref 12.0–15.0)
MCH: 31 pg (ref 26.0–34.0)
MCHC: 34.4 g/dL (ref 30.0–36.0)
MCV: 90.1 fL (ref 80.0–100.0)
Platelets: 249 10*3/uL (ref 150–400)
RBC: 4.65 MIL/uL (ref 3.87–5.11)
RDW: 12 % (ref 11.5–15.5)
WBC: 7.7 10*3/uL (ref 4.0–10.5)
nRBC: 0 % (ref 0.0–0.2)

## 2024-03-14 LAB — TSH: TSH: 1.59 u[IU]/mL (ref 0.350–4.500)

## 2024-03-14 MED ORDER — PROPRANOLOL HCL 10 MG PO TABS: 10.0000 mg | ORAL_TABLET | Freq: Two times a day (BID) | ORAL | 0 refills | Status: AC

## 2024-03-14 NOTE — ED Provider Notes (Signed)
 " [facility]    CSN: [id number] Arrival date & time: 03/14/24  1249      History   Chief Complaint Chief Complaint  Patient presents with   Hypertension    HPI Megan Garner is a 64 y.o. female.    Hypertension  Here for elevated blood pressure.  About 1 month ago she was placed on Wellbutrin 150 mg XL for some downturn in mood.  She was already taking Viibryd 40 mg daily She developed agitation and palpitations that she spoke to her primary care about on February 2.  There was concern she was at least experiencing side effects from the Wellbutrin if not serotonin syndrome.  She was therefore instructed to stop the Wellbutrin.  Since then the agitation and palpitations have resolved.  Her blood pressure remains elevated, however.  She was prescribed propranolol  10 mg to take 2 times a day as needed.  The highest blood pressure she has seen at home has been 170/110, the most numbers have been in the range that we got today 144/94.  She has been taking the blood pressure medication as long as it is higher than 130 systolic.  She is allergic to Flagyl    Past Medical History:  Diagnosis Date   Allergy    cyclical cough   Asthma    Melanoma in situ [facility])     Patient Active Problem List   Diagnosis Date Noted   Well adult exam 07/23/2023   Cervical high risk human papillomavirus (HPV) DNA test positive 07/11/2023   Abdominal cramps 07/11/2023   Cramps, extremity 05/03/2023   Goiter 09/27/2022   Fatigue 09/27/2022   Atrophy of vagina 09/13/2021   Postmenopausal state 09/13/2021   Contact dermatitis 09/13/2021   Mixed hyperlipidemia 05/23/2021   Vertigo 12/07/2020   Atherosclerosis of aorta 08/06/2020   Coronary atherosclerosis 08/06/2020   Family history of colon cancer in father 03/18/2018   History of melanoma 03/18/2018   Grief 02/24/2017   Anxiety disorder 01/06/2017   Screening for colon cancer 08/27/2013   Warts 04/24/2011   Cold sore 07/02/2007    Acute upper respiratory infection 02/13/2007   Allergic rhinitis 02/13/2007   Asthma 02/13/2007   Melanoma in situ (HCC) 08/08/1983    Past Surgical History:  Procedure Laterality Date   FACIAL COSMETIC SURGERY      OB History   No obstetric history on file.      Home Medications    Prior to Admission medications  Medication Sig Start Date End Date Taking? Authorizing Provider  propranolol  (INDERAL ) 10 MG tablet Take 1-2 tablets (10-20 mg total) by mouth 2 (two) times daily. For blood pressure 03/14/24  Yes Marqueze Ramcharan K, MD  Cholecalciferol (VITAMIN D3) 50 MCG (2000 UT) capsule Take 1 capsule (2,000 Units total) by mouth daily. 06/11/19   Plotnikov, Aleksei V, MD  citalopram  (CELEXA ) 20 MG tablet Take 1 tablet by mouth daily.    [provider]  lipase/protease/amylase (CREON ) 36000 UNITS CPEP capsule Take 1 capsule (36,000 Units total) by mouth 3 (three) times daily before meals. Take with the first bite of food 07/11/23   Plotnikov, Karlynn GAILS, MD  LORazepam  (ATIVAN ) 1 MG tablet TAKE 1/2 TO 1 TABLET BY MOUTH TWICE A DAY AS NEEDED FOR ANXIETY/SLEEP 05/03/23   Plotnikov, Aleksei V, MD  Magnesium 100 MG CAPS Take 100 mg by mouth daily.    [provider]  misoprostol (CYTOTEC) 200 MCG tablet 200 mcg.    [provider]  Omega-3 Fatty Acids (FISH OIL) 1000 MG CAPS Take 1 each by mouth daily.      [provider]  predniSONE  (DELTASONE ) 20 MG tablet TAKE 1 TABLET EVERY DAY AS NEEDED 07/11/23   Plotnikov, Aleksei V, MD  rosuvastatin  (CRESTOR ) 5 MG tablet TAKE 1 TABLET BY MOUTH DAILY 03/05/23   Plotnikov, Aleksei V, MD  saccharomyces boulardii (FLORASTOR) 250 MG capsule Take 1 capsule (250 mg total) by mouth 2 (two) times daily. 07/11/23   Plotnikov, Aleksei V, MD  valACYclovir  (VALTREX ) 1000 MG tablet Take 1 by mouth twice a day as needed 07/11/23   Plotnikov, Aleksei V, MD  Vilazodone HCl (VIIBRYD) 40 MG TABS Take 40 mg by mouth daily.    [provider]    Family History Family History  Problem Relation Age of Onset   Hypertension Mother    Arthritis Mother        gout   Heart disease Mother 11       STENT   Cancer Father 74       colon ca   Hypertension Other     Social History Social History[1]   Allergies   Metronidazole  and Tea tree oil   Review of Systems Review of Systems   Physical Exam Triage Vital Signs ED Triage Vitals  Encounter Vitals Group     BP 03/14/24 1318 (!) 144/94     Girls Systolic BP Percentile --      Girls Diastolic BP Percentile --      Boys Systolic BP Percentile --      Boys Diastolic BP Percentile --      Pulse Rate 03/14/24 1318 77     Resp 03/14/24 1318 16     Temp 03/14/24 1318 (!) 97.4 F (36.3 C)     Temp Source 03/14/24 1318 Oral     SpO2 03/14/24 1318 98 %     Weight --      Height --      Head Circumference --      Peak Flow --      Pain Score 03/14/24 1316 0     Pain Loc --      Pain Education --      Exclude from Growth Chart --    No data found.  Updated Vital Signs BP (!) 144/94 (BP Location: Left Arm)   Pulse 77   Temp (!) 97.4 F (36.3 C) (Oral)   Resp 16   LMP 03/24/2011   SpO2 98%   Visual Acuity Right Eye Distance:   Left Eye Distance:   Bilateral Distance:    Right Eye Near:   Left Eye Near:    Bilateral Near:     Physical Exam Vitals reviewed.  Constitutional:      General: She is not in acute distress.    Appearance: She is not ill-appearing, toxic-appearing or diaphoretic.  HENT:     Nose: Nose normal.     Mouth/Throat:     Mouth: Mucous membranes are moist.     Pharynx: No oropharyngeal exudate or posterior oropharyngeal erythema.  Eyes:     Extraocular Movements: Extraocular movements intact.     Conjunctiva/sclera: Conjunctivae normal.     Pupils: Pupils are equal, round, and reactive to light.  Cardiovascular:     Rate and Rhythm: Normal rate and regular rhythm.     Heart sounds: No murmur heard. Pulmonary:     Effort:  Pulmonary effort is normal.  Breath sounds: Normal breath sounds.  Musculoskeletal:     Cervical back: Neck supple.  Lymphadenopathy:     Cervical: No cervical adenopathy.  Skin:    Coloration: Skin is not jaundiced or pale.  Neurological:     General: No focal deficit present.     Mental Status: She is alert and oriented to person, place, and time.     Comments: No tremor and no clonus  Psychiatric:        Behavior: Behavior normal.      UC Treatments / Results  Labs (all labs ordered are listed, but only abnormal results are displayed) Labs Reviewed  COMPREHENSIVE METABOLIC PANEL WITH GFR  CBC  TSH    EKG   Radiology No results found.  Procedures Procedures (including critical care time)  Medications Ordered in UC Medications - No data to display  Initial Impression / Assessment and Plan / UC Course  I have reviewed the triage vital signs and the nursing notes.  Pertinent labs & imaging results that were available during my care of the patient were reviewed by me and considered in my medical decision making (see chart for details).     With no tremulousness or clonus, I do not think she is experiencing serotonin syndrome strictly speaking at this time. CBC, BMP, and TSH are drawn today to assess for other causes of her symptoms.  We will notify her if anything significantly abnormal.  For the short-term I am going to increase her propranolol  to say 1 to 2 tablets twice daily for the blood pressure.  She will follow-up with her primary care.  She will go to the emergency room if feeling worse   Final Clinical Impressions(s) / UC Diagnoses   Final diagnoses:  Elevated blood pressure reading     Discharge Instructions      We have drawn blood to check for blood counts, sugar and electrolytes and thyroid  function.  Staff will notify you if there is anything significantly abnormal  You can change your propranolol  dosing to 1 to 2 tablets twice daily for  blood pressure.  As you know, goal blood pressure is less than 140 on the top and less than 90 on the bottom.  Please follow-up with your primary care  Go to the emergency room if you feel worse in any way.      ED Prescriptions     Medication Sig Dispense Auth. Provider   propranolol  (INDERAL ) 10 MG tablet Take 1-2 tablets (10-20 mg total) by mouth 2 (two) times daily. For blood pressure 120 tablet Luvina Poirier K, MD      PDMP not reviewed this encounter.    [1]  Social History Tobacco Use   Smoking status: Never   Smokeless tobacco: Never  Vaping Use   Vaping status: Never Used  Substance Use Topics   Alcohol use: Yes    Comment: weekly   Drug use: Yes    Comment: THC edible     Vonna Sharlet POUR, MD 03/14/24 1356  "

## 2024-03-14 NOTE — ED Triage Notes (Addendum)
 Pt reports started Wellbutrin about month ago and put her in serotonin syndrome. Was told to stop taking Monday or Tuesday this week. Got started on propanolol and took today but not helping.  Pt reports her BP has been 145/95 but today Bp was 170/100. Denies headache, dizziness.

## 2024-03-14 NOTE — Discharge Instructions (Addendum)
 We have drawn blood to check for blood counts, sugar and electrolytes and thyroid  function.  Staff will notify you if there is anything significantly abnormal  You can change your propranolol  dosing to 1 to 2 tablets twice daily for blood pressure.  As you know, goal blood pressure is less than 140 on the top and less than 90 on the bottom.  Please follow-up with your primary care  Go to the emergency room if you feel worse in any way.
